# Patient Record
Sex: Female | Born: 1980 | Race: Black or African American | Marital: Married
Health system: Southern US, Community
[De-identification: ages and names within clinical notes are randomized; demographics above are authoritative.]

## PROBLEM LIST (undated history)

## (undated) DIAGNOSIS — M549 Dorsalgia, unspecified: Secondary | ICD-10-CM

## (undated) DIAGNOSIS — E559 Vitamin D deficiency, unspecified: Secondary | ICD-10-CM

## (undated) DIAGNOSIS — K259 Gastric ulcer, unspecified as acute or chronic, without hemorrhage or perforation: Secondary | ICD-10-CM

## (undated) DIAGNOSIS — K8020 Calculus of gallbladder without cholecystitis without obstruction: Secondary | ICD-10-CM

## (undated) DIAGNOSIS — D219 Benign neoplasm of connective and other soft tissue, unspecified: Secondary | ICD-10-CM

## (undated) DIAGNOSIS — D509 Iron deficiency anemia, unspecified: Secondary | ICD-10-CM

## (undated) DIAGNOSIS — M1990 Unspecified osteoarthritis, unspecified site: Secondary | ICD-10-CM

## (undated) DIAGNOSIS — F419 Anxiety disorder, unspecified: Secondary | ICD-10-CM

## (undated) DIAGNOSIS — R0602 Shortness of breath: Secondary | ICD-10-CM

## (undated) DIAGNOSIS — E7800 Pure hypercholesterolemia, unspecified: Secondary | ICD-10-CM

## (undated) DIAGNOSIS — J45990 Exercise induced bronchospasm: Secondary | ICD-10-CM

## (undated) DIAGNOSIS — E739 Lactose intolerance, unspecified: Secondary | ICD-10-CM

## (undated) DIAGNOSIS — E282 Polycystic ovarian syndrome: Secondary | ICD-10-CM

## (undated) DIAGNOSIS — N736 Female pelvic peritoneal adhesions (postinfective): Secondary | ICD-10-CM

## (undated) HISTORY — DX: Unspecified osteoarthritis, unspecified site: M19.90

## (undated) HISTORY — DX: Shortness of breath: R06.02

## (undated) HISTORY — DX: Vitamin D deficiency, unspecified: E55.9

## (undated) HISTORY — DX: Lactose intolerance, unspecified: E73.9

## (undated) HISTORY — DX: Calculus of gallbladder without cholecystitis without obstruction: K80.20

## (undated) HISTORY — DX: Gastric ulcer, unspecified as acute or chronic, without hemorrhage or perforation: K25.9

## (undated) HISTORY — DX: Anxiety disorder, unspecified: F41.9

## (undated) HISTORY — PX: MYOMECTOMY: SHX85

## (undated) HISTORY — DX: Dorsalgia, unspecified: M54.9

## (undated) HISTORY — DX: Iron deficiency anemia, unspecified: D50.9

## (undated) HISTORY — DX: Female pelvic peritoneal adhesions (postinfective): N73.6

## (undated) HISTORY — DX: Pure hypercholesterolemia, unspecified: E78.00

## (undated) HISTORY — DX: Exercise induced bronchospasm: J45.990

---

## 2018-03-11 LAB — OB RESULTS CONSOLE HEPATITIS B SURFACE ANTIGEN: Hepatitis B Surface Ag: NEGATIVE

## 2018-03-11 LAB — OB RESULTS CONSOLE ABO/RH: RH Type: POSITIVE

## 2018-03-11 LAB — OB RESULTS CONSOLE HIV ANTIBODY (ROUTINE TESTING): HIV: NONREACTIVE

## 2018-03-11 LAB — OB RESULTS CONSOLE RPR: RPR: NONREACTIVE

## 2018-03-11 LAB — OB RESULTS CONSOLE ANTIBODY SCREEN: Antibody Screen: NEGATIVE

## 2018-03-11 LAB — OB RESULTS CONSOLE GC/CHLAMYDIA
Chlamydia: NEGATIVE
Gonorrhea: NEGATIVE

## 2018-03-11 LAB — OB RESULTS CONSOLE RUBELLA ANTIBODY, IGM: Rubella: IMMUNE

## 2018-07-23 ENCOUNTER — Inpatient Hospital Stay (HOSPITAL_COMMUNITY)
Admission: AD | Admit: 2018-07-23 | Discharge: 2018-07-23 | Disposition: A | Discharge status: Home / Self Care | Payer: Commercial Managed Care - PPO | Attending: Obstetrics and Gynecology | Admitting: Obstetrics and Gynecology

## 2018-07-23 ENCOUNTER — Encounter (HOSPITAL_COMMUNITY): Payer: Self-pay

## 2018-07-23 ENCOUNTER — Other Ambulatory Visit: Payer: Self-pay

## 2018-07-23 DIAGNOSIS — Z3A27 27 weeks gestation of pregnancy: Secondary | ICD-10-CM | POA: Insufficient documentation

## 2018-07-23 DIAGNOSIS — O4702 False labor before 37 completed weeks of gestation, second trimester: Secondary | ICD-10-CM

## 2018-07-23 DIAGNOSIS — O368120 Decreased fetal movements, second trimester, not applicable or unspecified: Secondary | ICD-10-CM | POA: Diagnosis present

## 2018-07-23 NOTE — MAU Provider Note (Signed)
History     Chief Complaint  Patient presents with  . Decreased Fetal Movement  38 yo G1P0 MBF @ 27 3/[redacted] weeks gestation presents with c/o decreased FM since yesterday. Pt felt movements on arrival (-) vaginal bleeding or ctx  OB History    Gravida  1   Para      Term      Preterm      AB      Living        SAB      TAB      Ectopic      Multiple      Live Births              History reviewed. No pertinent past medical history.  Hx myomectomy  No family history on file.  Social History   Tobacco Use  . Smoking status: Not on file  Substance Use Topics  . Alcohol use: Not on file  . Drug use: Not on file    Allergies: No Known Allergies  Medications Prior to Admission  Medication Sig Dispense Refill Last Dose  . Prenatal Vit-Fe Fumarate-FA (MULTIVITAMIN-PRENATAL) 27-0.8 MG TABS tablet Take 1 tablet by mouth daily at 12 noon.        Physical Exam   Blood pressure 118/75, pulse 98, temperature 98.4 F (36.9 C), temperature source Oral, resp. rate 17, height 5\' 2"  (1.575 m), weight 98.4 kg, last menstrual period 12/19/2017.  General appearance: alert, cooperative and no distress Abdomen: gravid Pelvic: external genitalia normal and closed/firm/long OOP Extremities: no edema, redness or tenderness in the calves or thighs   Tracing: baseline 140 short variables. Good variability for gest age (+) ctx s not perceived by pt ED Course  DFM [place] affecting pregnancy P) daily kick ct. Reassurance. Increase oral fluid intake. Keep sched OB appt. ucx sent. PTL prec. D/c home MDM   Marvene Staff, MD 4:54 AM 07/23/2018

## 2018-07-23 NOTE — MAU Note (Signed)
Pt here with c/o decreased fetal movement since yesterday. Feeling baby now. Denies bleeding or leaking.

## 2018-07-23 NOTE — Discharge Instructions (Signed)

## 2018-07-24 LAB — CULTURE, OB URINE
Culture: NO GROWTH
Special Requests: NORMAL

## 2018-08-19 ENCOUNTER — Encounter (HOSPITAL_COMMUNITY): Payer: Self-pay

## 2018-08-31 ENCOUNTER — Other Ambulatory Visit: Payer: Self-pay | Admitting: Obstetrics and Gynecology

## 2018-09-17 ENCOUNTER — Encounter (HOSPITAL_COMMUNITY): Payer: Self-pay

## 2018-09-17 NOTE — Patient Instructions (Addendum)
ETTEL ALBERGO  09/17/2018   Your procedure is scheduled on:  09/28/18  Arrive at 19 at [facility] on [address] at [facility]  and [address]. You are invited to use the FREE valet parking or use the Visitor's parking deck.  Pick up the phone at the desk and dial [phone].  Call this number if you have problems the morning of surgery: [phone]  Remember:   Do not eat food:(After Midnight) Desps de medianoche.  Do not drink clear liquids: (After Midnight) Desps de medianoche.  Take these medicines the morning of surgery with A SIP OF WATER:  none   Do not wear jewelry, make-up or nail polish.  Do not wear lotions, powders, or perfumes. Do not wear deodorant.  Do not shave 48 hours prior to surgery.  Do not bring valuables to the hospital.  [facility] is not   responsible for any belongings or valuables brought to the hospital.  Contacts, dentures or bridgework may not be worn into surgery.  Leave suitcase in the car. After surgery it may be brought to your room.  For patients admitted to the hospital, checkout time is 11:00 AM the day of              discharge.      Please read over the following fact sheets that you were given:     Preparing for Surgery

## 2018-09-24 ENCOUNTER — Ambulatory Visit (HOSPITAL_COMMUNITY)
Admission: RE | Admit: 2018-09-24 | Discharge: 2018-09-24 | Disposition: A | Discharge status: Home / Self Care | Payer: Commercial Managed Care - PPO | Source: Ambulatory Visit | Attending: Obstetrics and Gynecology | Admitting: Obstetrics and Gynecology

## 2018-09-24 ENCOUNTER — Other Ambulatory Visit: Payer: Self-pay

## 2018-09-24 DIAGNOSIS — Z01812 Encounter for preprocedural laboratory examination: Secondary | ICD-10-CM | POA: Insufficient documentation

## 2018-09-24 DIAGNOSIS — Z1159 Encounter for screening for other viral diseases: Secondary | ICD-10-CM | POA: Diagnosis not present

## 2018-09-24 HISTORY — DX: Benign neoplasm of connective and other soft tissue, unspecified: D21.9

## 2018-09-24 HISTORY — DX: Polycystic ovarian syndrome: E28.2

## 2018-09-24 LAB — SARS CORONAVIRUS 2 (TAT 6-24 HRS): SARS Coronavirus 2: NEGATIVE

## 2018-09-24 NOTE — MAU Note (Signed)
Asymptomatic, swab collected.

## 2018-09-28 ENCOUNTER — Encounter (HOSPITAL_COMMUNITY): Payer: Self-pay | Admitting: *Deleted

## 2018-09-28 ENCOUNTER — Other Ambulatory Visit: Payer: Self-pay

## 2018-09-28 ENCOUNTER — Inpatient Hospital Stay (HOSPITAL_COMMUNITY): Payer: Commercial Managed Care - PPO | Admitting: Certified Registered Nurse Anesthetist

## 2018-09-28 ENCOUNTER — Encounter (HOSPITAL_COMMUNITY)
Admission: AD | Disposition: A | Discharge status: Home / Self Care | Payer: Self-pay | Source: Home / Self Care | Attending: Obstetrics and Gynecology

## 2018-09-28 ENCOUNTER — Inpatient Hospital Stay (HOSPITAL_COMMUNITY)
Admission: AD | Admit: 2018-09-28 | Discharge: 2018-10-01 | DRG: 787 | Disposition: A | Discharge status: Home / Self Care | Payer: Commercial Managed Care - PPO | Attending: Obstetrics and Gynecology | Admitting: Obstetrics and Gynecology

## 2018-09-28 DIAGNOSIS — D62 Acute posthemorrhagic anemia: Secondary | ICD-10-CM | POA: Diagnosis not present

## 2018-09-28 DIAGNOSIS — D509 Iron deficiency anemia, unspecified: Secondary | ICD-10-CM | POA: Diagnosis present

## 2018-09-28 DIAGNOSIS — Z9889 Other specified postprocedural states: Secondary | ICD-10-CM

## 2018-09-28 DIAGNOSIS — O9081 Anemia of the puerperium: Secondary | ICD-10-CM | POA: Diagnosis not present

## 2018-09-28 DIAGNOSIS — Z90711 Acquired absence of uterus with remaining cervical stump: Secondary | ICD-10-CM

## 2018-09-28 DIAGNOSIS — Z3A37 37 weeks gestation of pregnancy: Secondary | ICD-10-CM | POA: Diagnosis not present

## 2018-09-28 DIAGNOSIS — O3429 Maternal care due to uterine scar from other previous surgery: Secondary | ICD-10-CM | POA: Diagnosis present

## 2018-09-28 LAB — CBC
HCT: 32.9 % — ABNORMAL LOW (ref 36.0–46.0)
HCT: 21.5 % — ABNORMAL LOW (ref 36.0–46.0)
Hemoglobin: 10.8 g/dL — ABNORMAL LOW (ref 12.0–15.0)
Hemoglobin: 7.1 g/dL — ABNORMAL LOW (ref 12.0–15.0)
MCH: 31 pg (ref 26.0–34.0)
MCH: 31.6 pg (ref 26.0–34.0)
MCHC: 32.8 g/dL (ref 30.0–36.0)
MCHC: 33 g/dL (ref 30.0–36.0)
MCV: 94.5 fL (ref 80.0–100.0)
MCV: 95.6 fL (ref 80.0–100.0)
Platelets: 162 10*3/uL (ref 150–400)
Platelets: 110 10*3/uL — ABNORMAL LOW (ref 150–400)
RBC: 3.48 MIL/uL — ABNORMAL LOW (ref 3.87–5.11)
RBC: 2.25 MIL/uL — ABNORMAL LOW (ref 3.87–5.11)
RDW: 14.8 % (ref 11.5–15.5)
RDW: 14.7 % (ref 11.5–15.5)
WBC: 12.1 10*3/uL — ABNORMAL HIGH (ref 4.0–10.5)
WBC: 15.6 10*3/uL — ABNORMAL HIGH (ref 4.0–10.5)
nRBC: 0.5 % — ABNORMAL HIGH (ref 0.0–0.2)
nRBC: 0.3 % — ABNORMAL HIGH (ref 0.0–0.2)

## 2018-09-28 LAB — ABO/RH: ABO/RH(D): O POS

## 2018-09-28 LAB — RPR: RPR Ser Ql: NONREACTIVE

## 2018-09-28 SURGERY — Surgical Case
Anesthesia: Spinal

## 2018-09-28 MED ORDER — NALOXONE HCL 4 MG/10ML IJ SOLN
1.0000 ug/kg/h | INTRAVENOUS | Status: DC | PRN
  Filled 2018-09-28: qty 5

## 2018-09-28 MED ORDER — DIPHENHYDRAMINE HCL 25 MG PO CAPS: 25.0000 mg | ORAL_CAPSULE | ORAL | Status: DC | PRN

## 2018-09-28 MED ORDER — FENTANYL CITRATE (PF) 100 MCG/2ML IJ SOLN
INTRAMUSCULAR | Status: AC
  Filled 2018-09-28: qty 2

## 2018-09-28 MED ORDER — MISOPROSTOL 200 MCG PO TABS
800.0000 ug | ORAL_TABLET | Freq: Once | ORAL | Status: AC
  Administered 2018-09-28: 13:00:00 800 ug via RECTAL
  Filled 2018-09-28: qty 4

## 2018-09-28 MED ORDER — CEFAZOLIN SODIUM-DEXTROSE 2-4 GM/100ML-% IV SOLN
2.0000 g | INTRAVENOUS | Status: AC
  Administered 2018-09-28: 2 g via INTRAVENOUS

## 2018-09-28 MED ORDER — OXYTOCIN 40 UNITS IN NORMAL SALINE INFUSION - SIMPLE MED: 2.5000 [IU]/h | INTRAVENOUS | Status: AC

## 2018-09-28 MED ORDER — DEXAMETHASONE SODIUM PHOSPHATE 4 MG/ML IJ SOLN
INTRAMUSCULAR | Status: AC
  Filled 2018-09-28: qty 1

## 2018-09-28 MED ORDER — LACTATED RINGERS IV SOLN
INTRAVENOUS | Status: DC
  Administered 2018-09-28 – 2018-09-29 (×2): via INTRAVENOUS

## 2018-09-28 MED ORDER — DIBUCAINE (PERIANAL) 1 % EX OINT: 1.0000 "application " | TOPICAL_OINTMENT | CUTANEOUS | Status: DC | PRN

## 2018-09-28 MED ORDER — PHENYLEPHRINE HCL-NACL 20-0.9 MG/250ML-% IV SOLN
INTRAVENOUS | Status: AC
  Filled 2018-09-28: qty 250

## 2018-09-28 MED ORDER — KETOROLAC TROMETHAMINE 30 MG/ML IJ SOLN
30.0000 mg | Freq: Four times a day (QID) | INTRAMUSCULAR | Status: AC | PRN
  Administered 2018-09-28: 10:00:00 30 mg via INTRAVENOUS

## 2018-09-28 MED ORDER — METOCLOPRAMIDE HCL 5 MG/ML IJ SOLN
INTRAMUSCULAR | Status: AC
  Filled 2018-09-28: qty 2

## 2018-09-28 MED ORDER — SENNOSIDES-DOCUSATE SODIUM 8.6-50 MG PO TABS
2.0000 | ORAL_TABLET | ORAL | Status: DC
  Administered 2018-09-28 – 2018-09-30 (×3): 2 via ORAL
  Filled 2018-09-28 (×3): qty 2

## 2018-09-28 MED ORDER — FENTANYL CITRATE (PF) 100 MCG/2ML IJ SOLN
25.0000 ug | INTRAMUSCULAR | Status: DC | PRN
  Administered 2018-09-28 (×2): 50 ug via INTRAVENOUS

## 2018-09-28 MED ORDER — ACETAMINOPHEN 500 MG PO TABS
1000.0000 mg | ORAL_TABLET | Freq: Four times a day (QID) | ORAL | Status: AC
  Administered 2018-09-28 – 2018-09-29 (×3): 1000 mg via ORAL
  Filled 2018-09-28 (×4): qty 2

## 2018-09-28 MED ORDER — WITCH HAZEL-GLYCERIN EX PADS: 1.0000 "application " | MEDICATED_PAD | CUTANEOUS | Status: DC | PRN

## 2018-09-28 MED ORDER — SCOPOLAMINE 1 MG/3DAYS TD PT72: 1.0000 | MEDICATED_PATCH | Freq: Once | TRANSDERMAL | Status: DC

## 2018-09-28 MED ORDER — ONDANSETRON HCL 4 MG/2ML IJ SOLN: 4.0000 mg | Freq: Three times a day (TID) | INTRAMUSCULAR | Status: DC | PRN

## 2018-09-28 MED ORDER — ONDANSETRON HCL 4 MG/2ML IJ SOLN
INTRAMUSCULAR | Status: DC | PRN
  Administered 2018-09-28: 4 mg via INTRAVENOUS

## 2018-09-28 MED ORDER — MEPERIDINE HCL 25 MG/ML IJ SOLN: 6.2500 mg | INTRAMUSCULAR | Status: DC | PRN

## 2018-09-28 MED ORDER — ALBUMIN HUMAN 5 % IV SOLN
INTRAVENOUS | Status: DC | PRN
  Administered 2018-09-28 (×2): via INTRAVENOUS

## 2018-09-28 MED ORDER — SODIUM CHLORIDE 0.9 % IV SOLN
INTRAVENOUS | Status: DC | PRN
  Administered 2018-09-28: 08:00:00 40 [IU] via INTRAVENOUS

## 2018-09-28 MED ORDER — NALBUPHINE HCL 10 MG/ML IJ SOLN: 5.0000 mg | Freq: Once | INTRAMUSCULAR | Status: DC | PRN

## 2018-09-28 MED ORDER — ONDANSETRON HCL 4 MG/2ML IJ SOLN
INTRAMUSCULAR | Status: AC
  Filled 2018-09-28: qty 2

## 2018-09-28 MED ORDER — BUPIVACAINE HCL (PF) 0.25 % IJ SOLN
INTRAMUSCULAR | Status: AC
  Filled 2018-09-28: qty 10

## 2018-09-28 MED ORDER — MORPHINE SULFATE (PF) 0.5 MG/ML IJ SOLN
INTRAMUSCULAR | Status: AC
  Filled 2018-09-28: qty 10

## 2018-09-28 MED ORDER — NALBUPHINE HCL 10 MG/ML IJ SOLN: 5.0000 mg | INTRAMUSCULAR | Status: DC | PRN

## 2018-09-28 MED ORDER — IBUPROFEN 800 MG PO TABS
800.0000 mg | ORAL_TABLET | Freq: Three times a day (TID) | ORAL | Status: AC
  Administered 2018-09-28 – 2018-10-01 (×9): 800 mg via ORAL
  Filled 2018-09-28 (×9): qty 1

## 2018-09-28 MED ORDER — NALOXONE HCL 0.4 MG/ML IJ SOLN: 0.4000 mg | INTRAMUSCULAR | Status: DC | PRN

## 2018-09-28 MED ORDER — LACTATED RINGERS IV SOLN: INTRAVENOUS | Status: DC

## 2018-09-28 MED ORDER — LACTATED RINGERS IV SOLN
INTRAVENOUS | Status: DC
  Administered 2018-09-28 (×4): via INTRAVENOUS

## 2018-09-28 MED ORDER — SIMETHICONE 80 MG PO CHEW
80.0000 mg | CHEWABLE_TABLET | ORAL | Status: DC
  Administered 2018-09-28 – 2018-09-30 (×3): 80 mg via ORAL
  Filled 2018-09-28 (×3): qty 1

## 2018-09-28 MED ORDER — TETANUS-DIPHTH-ACELL PERTUSSIS 5-2.5-18.5 LF-MCG/0.5 IM SUSP: 0.5000 mL | Freq: Once | INTRAMUSCULAR | Status: DC

## 2018-09-28 MED ORDER — FENTANYL CITRATE (PF) 100 MCG/2ML IJ SOLN
INTRAMUSCULAR | Status: DC | PRN
  Administered 2018-09-28: 15 ug via INTRATHECAL

## 2018-09-28 MED ORDER — DIPHENHYDRAMINE HCL 25 MG PO CAPS: 25.0000 mg | ORAL_CAPSULE | Freq: Four times a day (QID) | ORAL | Status: DC | PRN

## 2018-09-28 MED ORDER — COCONUT OIL OIL
1.0000 "application " | TOPICAL_OIL | Status: DC | PRN
  Administered 2018-09-30: 1 via TOPICAL

## 2018-09-28 MED ORDER — ALBUMIN HUMAN 5 % IV SOLN
INTRAVENOUS | Status: AC
  Filled 2018-09-28: qty 500

## 2018-09-28 MED ORDER — ZOLPIDEM TARTRATE 5 MG PO TABS: 5.0000 mg | ORAL_TABLET | Freq: Every evening | ORAL | Status: DC | PRN

## 2018-09-28 MED ORDER — DEXAMETHASONE SODIUM PHOSPHATE 4 MG/ML IJ SOLN
INTRAMUSCULAR | Status: DC | PRN
  Administered 2018-09-28: 4 mg via INTRAVENOUS

## 2018-09-28 MED ORDER — METOCLOPRAMIDE HCL 5 MG/ML IJ SOLN
INTRAMUSCULAR | Status: DC | PRN
  Administered 2018-09-28: 10 mg via INTRAVENOUS

## 2018-09-28 MED ORDER — OXYTOCIN 40 UNITS IN NORMAL SALINE INFUSION - SIMPLE MED
INTRAVENOUS | Status: AC
  Filled 2018-09-28: qty 1000

## 2018-09-28 MED ORDER — CEFAZOLIN SODIUM-DEXTROSE 2-3 GM-%(50ML) IV SOLR
INTRAVENOUS | Status: DC | PRN
  Administered 2018-09-28: 2 g via INTRAVENOUS

## 2018-09-28 MED ORDER — OXYCODONE HCL 5 MG PO TABS
5.0000 mg | ORAL_TABLET | ORAL | Status: DC | PRN
  Administered 2018-09-29: 5 mg via ORAL
  Administered 2018-09-29 (×2): 10 mg via ORAL
  Administered 2018-09-30: 5 mg via ORAL
  Administered 2018-09-30 (×2): 10 mg via ORAL
  Administered 2018-10-01: 5 mg via ORAL
  Filled 2018-09-28 (×2): qty 1
  Filled 2018-09-28 (×2): qty 2
  Filled 2018-09-28: qty 1
  Filled 2018-09-28 (×2): qty 2

## 2018-09-28 MED ORDER — SIMETHICONE 80 MG PO CHEW
80.0000 mg | CHEWABLE_TABLET | Freq: Three times a day (TID) | ORAL | Status: DC
  Administered 2018-09-28 – 2018-10-01 (×8): 80 mg via ORAL
  Filled 2018-09-28 (×9): qty 1

## 2018-09-28 MED ORDER — SODIUM CHLORIDE 0.9% FLUSH: 3.0000 mL | INTRAVENOUS | Status: DC | PRN

## 2018-09-28 MED ORDER — SODIUM CHLORIDE 0.9 % IV SOLN
INTRAVENOUS | Status: DC | PRN
  Administered 2018-09-28: 08:00:00 via INTRAVENOUS

## 2018-09-28 MED ORDER — KETOROLAC TROMETHAMINE 30 MG/ML IJ SOLN
INTRAMUSCULAR | Status: AC
  Filled 2018-09-28: qty 1

## 2018-09-28 MED ORDER — BUPIVACAINE HCL (PF) 0.25 % IJ SOLN
INTRAMUSCULAR | Status: DC | PRN
  Administered 2018-09-28: 10 mL

## 2018-09-28 MED ORDER — MENTHOL 3 MG MT LOZG: 1.0000 | LOZENGE | OROMUCOSAL | Status: DC | PRN

## 2018-09-28 MED ORDER — PHENYLEPHRINE HCL (PRESSORS) 10 MG/ML IV SOLN
INTRAVENOUS | Status: DC | PRN
  Administered 2018-09-28: 120 ug via INTRAVENOUS
  Administered 2018-09-28 (×2): 40 ug via INTRAVENOUS

## 2018-09-28 MED ORDER — SODIUM CHLORIDE 0.9 % IR SOLN
Status: DC | PRN
  Administered 2018-09-28: 1

## 2018-09-28 MED ORDER — TRIAMCINOLONE ACETONIDE 40 MG/ML IJ SUSP
INTRAMUSCULAR | Status: DC | PRN
  Administered 2018-09-28: 40 mg via INTRAMUSCULAR

## 2018-09-28 MED ORDER — KETOROLAC TROMETHAMINE 30 MG/ML IJ SOLN: 30.0000 mg | Freq: Four times a day (QID) | INTRAMUSCULAR | Status: AC | PRN

## 2018-09-28 MED ORDER — MORPHINE SULFATE (PF) 0.5 MG/ML IJ SOLN
INTRAMUSCULAR | Status: DC | PRN
  Administered 2018-09-28: .15 mg via INTRATHECAL

## 2018-09-28 MED ORDER — PHENYLEPHRINE HCL-NACL 20-0.9 MG/250ML-% IV SOLN
INTRAVENOUS | Status: DC | PRN
  Administered 2018-09-28: 60 ug/min via INTRAVENOUS

## 2018-09-28 MED ORDER — DIPHENHYDRAMINE HCL 50 MG/ML IJ SOLN: 12.5000 mg | INTRAMUSCULAR | Status: DC | PRN

## 2018-09-28 MED ORDER — TRIAMCINOLONE ACETONIDE 40 MG/ML IJ SUSP
INTRAMUSCULAR | Status: AC
  Filled 2018-09-28: qty 1

## 2018-09-28 MED ORDER — BUPIVACAINE IN DEXTROSE 0.75-8.25 % IT SOLN
INTRATHECAL | Status: DC | PRN
  Administered 2018-09-28: 1.4 mL via INTRATHECAL

## 2018-09-28 MED ORDER — PRENATAL MULTIVITAMIN CH
1.0000 | ORAL_TABLET | Freq: Every day | ORAL | Status: DC
  Administered 2018-09-29 – 2018-10-01 (×3): 1 via ORAL
  Filled 2018-09-28 (×3): qty 1

## 2018-09-28 MED ORDER — CEFAZOLIN SODIUM-DEXTROSE 2-4 GM/100ML-% IV SOLN
INTRAVENOUS | Status: AC
  Filled 2018-09-28: qty 100

## 2018-09-28 MED ORDER — SIMETHICONE 80 MG PO CHEW: 80.0000 mg | CHEWABLE_TABLET | ORAL | Status: DC | PRN

## 2018-09-28 MED ORDER — METOCLOPRAMIDE HCL 5 MG/ML IJ SOLN: 10.0000 mg | Freq: Once | INTRAMUSCULAR | Status: DC | PRN

## 2018-09-28 SURGICAL SUPPLY — 49 items
BARRIER ADHS 3X4 INTERCEED (GAUZE/BANDAGES/DRESSINGS) ×3 IMPLANT
BENZOIN TINCTURE PRP APPL 2/3 (GAUZE/BANDAGES/DRESSINGS) ×2 IMPLANT
CHLORAPREP W/TINT 26ML (MISCELLANEOUS) ×3 IMPLANT
CLAMP CORD UMBIL (MISCELLANEOUS) IMPLANT
CLOSURE STERI STRIP 1/2 X4 (GAUZE/BANDAGES/DRESSINGS) ×2 IMPLANT
CLOSURE WOUND 1/2 X4 (GAUZE/BANDAGES/DRESSINGS)
CLOTH BEACON ORANGE TIMEOUT ST (SAFETY) ×3 IMPLANT
DRAPE C SECTION CLR SCREEN (DRAPES) ×3 IMPLANT
DRSG OPSITE POSTOP 4X10 (GAUZE/BANDAGES/DRESSINGS) ×3 IMPLANT
ELECT REM PT RETURN 9FT ADLT (ELECTROSURGICAL) ×3
ELECTRODE REM PT RTRN 9FT ADLT (ELECTROSURGICAL) ×1 IMPLANT
EXTRACTOR VACUUM M CUP 4 TUBE (SUCTIONS) IMPLANT
EXTRACTOR VACUUM M CUP 4' TUBE (SUCTIONS)
GAUZE SPONGE 4X4 12PLY STRL LF (GAUZE/BANDAGES/DRESSINGS) ×4 IMPLANT
GLOVE BIOGEL PI IND STRL 7.0 (GLOVE) ×2 IMPLANT
GLOVE BIOGEL PI INDICATOR 7.0 (GLOVE) ×4
GLOVE ECLIPSE 6.5 STRL STRAW (GLOVE) ×3 IMPLANT
GOWN STRL REUS W/TWL LRG LVL3 (GOWN DISPOSABLE) ×6 IMPLANT
KIT ABG SYR 3ML LUER SLIP (SYRINGE) ×2 IMPLANT
NDL HYPO 25X5/8 SAFETYGLIDE (NEEDLE) IMPLANT
NEEDLE HYPO 22GX1.5 SAFETY (NEEDLE) ×3 IMPLANT
NEEDLE HYPO 25X5/8 SAFETYGLIDE (NEEDLE) ×3 IMPLANT
NS IRRIG 1000ML POUR BTL (IV SOLUTION) ×3 IMPLANT
PACK C SECTION WH (CUSTOM PROCEDURE TRAY) ×3 IMPLANT
PAD ABD 7.5X8 STRL (GAUZE/BANDAGES/DRESSINGS) ×4 IMPLANT
PAD OB MATERNITY 4.3X12.25 (PERSONAL CARE ITEMS) ×3 IMPLANT
RETRACTOR TRAXI PANNICULUS (MISCELLANEOUS) IMPLANT
RTRCTR C-SECT PINK 25CM LRG (MISCELLANEOUS) IMPLANT
STRIP CLOSURE SKIN 1/2X4 (GAUZE/BANDAGES/DRESSINGS) IMPLANT
SUT CHROMIC GUT AB #0 18 (SUTURE) IMPLANT
SUT MNCRL 0 VIOLET CTX 36 (SUTURE) ×3 IMPLANT
SUT MON AB 2-0 SH 27 (SUTURE)
SUT MON AB 2-0 SH27 (SUTURE) IMPLANT
SUT MON AB 3-0 SH 27 (SUTURE)
SUT MON AB 3-0 SH27 (SUTURE) IMPLANT
SUT MON AB 4-0 PS1 27 (SUTURE) IMPLANT
SUT MONOCRYL 0 CTX 36 (SUTURE) ×12
SUT PLAIN 2 0 (SUTURE)
SUT PLAIN 2 0 XLH (SUTURE) IMPLANT
SUT PLAIN ABS 2-0 CT1 27XMFL (SUTURE) IMPLANT
SUT VIC AB 0 CT1 36 (SUTURE) ×6 IMPLANT
SUT VIC AB 2-0 CT1 27 (SUTURE) ×2
SUT VIC AB 2-0 CT1 TAPERPNT 27 (SUTURE) ×1 IMPLANT
SUT VIC AB 4-0 PS2 27 (SUTURE) IMPLANT
SYR CONTROL 10ML LL (SYRINGE) ×3 IMPLANT
TOWEL OR 17X24 6PK STRL BLUE (TOWEL DISPOSABLE) ×3 IMPLANT
TRAXI PANNICULUS RETRACTOR (MISCELLANEOUS) ×2
TRAY FOLEY W/BAG SLVR 14FR LF (SET/KITS/TRAYS/PACK) IMPLANT
WATER STERILE IRR 1000ML POUR (IV SOLUTION) ×3 IMPLANT

## 2018-09-28 NOTE — Lactation Note (Signed)
This note was copied from a baby's chart. Lactation Consultation Note  Patient Name: Cheyenne Keller [id number] Date: 09/28/2018 Reason for consult: Initial assessment  0211 - 1735 - I visited [name]. She was breast feeding her daughter, [name] on her right breast upon entry. Mom was lying down and baby was prone over mom's chest in cradle hold. Rhythmic suckling sequences noted.   I provided reassurance to [name] that baby was able to breath in this position, and I discouraged her from pulling back on the breast tissue to make an "air hole." I gently rotated baby's hips a bit to help mom observe baby's nostril while feeding, and we observed her abdomen.  I discussed day 1 infant feeding patterns, output expectations (her diaper appeared wet while feeding), and I recommended that she feed baby on demand.   Ms. Galluzzo has a Training and development officer pump at home that she obtained via insurance.  I showed [name] how to hand express, and we noted colostrum. I encouraged frequent hand expression today for stimulation, and mom can feed back any EBM to baby.  Finally, I shared our community breast feeding resources and the lactation study invitation. [name] agreed to see the lactation study invitation, and I left the information in her room.   Maternal Data Formula Feeding for Exclusion: No Has patient been taught Hand Expression?: Yes Does the patient have breastfeeding experience prior to this delivery?: No  Feeding Feeding Type: Breast Fed  LATCH Score Latch: Grasps breast easily, tongue down, lips flanged, rhythmical sucking.  Audible Swallowing: Spontaneous and intermittent  Type of Nipple: Everted at rest and after stimulation  Comfort (Breast/Nipple): Filling, red/small blisters or bruises, mild/mod discomfort  Hold (Positioning): No assistance needed to correctly position infant at breast.  LATCH Score: 9  Interventions Interventions:  Breast feeding basics reviewed;Skin to skin;Hand express;Adjust position  Lactation Tools Discussed/Used     Consult Status Consult Status: Follow-up Date: 09/29/18 Follow-up type: In-patient    Lenore Manner 09/28/2018, 5:39 PM

## 2018-09-28 NOTE — Brief Op Note (Signed)
09/28/2018  9:03 AM  PATIENT:  Cheyenne Keller  38 y.o. female  PRE-OPERATIVE DIAGNOSIS:  Previous Myomectomy, IUP@ 37 wk  POST-OPERATIVE DIAGNOSIS:  Previous Myomectomy, IUP @ 37 weeks, severe abdominopelvic adhesions  PROCEDURE:  Primary Cesarean section, Classical hysterotomy  SURGEON:  Surgeon(s) and Role:    * Servando Salina, MD - Primary  PHYSICIAN ASSISTANT:   ASSISTANTS: Artelia Laroche, CNM   ANESTHESIA:   spinal FINDINGS;  Live female vtx, delivered as breech, severe adhesions on left ant side of the uterus to abd wall. Undeveloped LUS with large vessels. Ovaries and tubes not seen due to scarring, distorted endom cavity with left uterine fibroid EBL:  975   BLOOD ADMINISTERED:none  DRAINS: none   LOCAL MEDICATIONS USED:  MARCAINE     SPECIMEN:  Source of Specimen:  placenta  DISPOSITION OF SPECIMEN:  N/A  COUNTS:  YES  TOURNIQUET:  * No tourniquets in log *  DICTATION: .Other Dictation: Dictation Number [id number]  PLAN OF CARE: Admit to inpatient   PATIENT DISPOSITION:  PACU - hemodynamically stable.   Delay start of Pharmacological VTE agent (>24hrs) due to surgical blood loss or risk of bleeding: no

## 2018-09-28 NOTE — Anesthesia Postprocedure Evaluation (Signed)
Anesthesia Post Note  Patient: Cheyenne Keller  Procedure(s) Performed: Primary CESAREAN SECTION (N/A )     Patient location during evaluation: PACU Anesthesia Type: Spinal Level of consciousness: awake and alert Pain management: pain level controlled Vital Signs Assessment: post-procedure vital signs reviewed and stable Respiratory status: spontaneous breathing and respiratory function stable Cardiovascular status: blood pressure returned to baseline and stable Postop Assessment: no headache, no backache, spinal receding and no apparent nausea or vomiting Anesthetic complications: no    Last Vitals:  Vitals:   09/28/18 1030 09/28/18 1128  BP: (!) 83/53 91/61  Pulse: 72 80  Resp: 14 16  Temp:  (!) 36.3 C  SpO2: 100% 100%    Last Pain:  Vitals:   09/28/18 1128  TempSrc: Oral  PainSc: 4    Pain Goal:                   [name]

## 2018-09-28 NOTE — Op Note (Signed)
NAME: Cheyenne Keller, Cheyenne Keller MEDICAL RECORD UX:[id number] ACCOUNT [id number] DATE OF BIRTH:1980/03/24 FACILITY: [facility] LOCATION: [facility] PHYSICIAN:Rodrigues Urbanek A. Willem Klingensmith, MD  OPERATIVE REPORT  DATE OF PROCEDURE:  09/28/2018  PREOPERATIVE DIAGNOSES:  Previous myomectomy, intrauterine gestation at 37 weeks, fibroid uterus.  PROCEDURE:  Primary cesarean section, classical uterine incision.  POSTOPERATIVE DIAGNOSES:  Previous myomectomy, intrauterine gestation at 37 weeks, fibroid uterus, extensive abdominopelvic adhesions.  ANESTHESIA:  Spinal.  SURGEON:  Servando Salina, MD  ASSISTANT:  Artelia Laroche, CNM  DESCRIPTION OF PROCEDURE:  Under adequate spinal anesthesia, the patient was placed in a supine position with a left lateral tilt.  She was sterilely prepped and draped in the usual fashion.  An indwelling Foley catheter was sterilely placed.  A  Pfannenstiel skin incision was made through the previous scar and carried down to the rectus fascia.  The rectus fascia was opened transversely with Mayo scissors.  The rectus fascia was then bluntly and sharply dissected off the rectus muscle in a  superior and inferior fashion.  The rectus muscle was sharply opened in the midline.  Attempt to enter the parietal peritoneum was difficult.  Ultimately, blunt dissection resulted in entry with palpation within the cavity, limited by adhesions, particularly on the patient's left side of her uterus.  There was an undeveloped lower uterine segment.  The adhesions did not allow for the Alexis retractor to be placed.  The bladder retractor was inserted.  Lower uterine segment had multiple large vessels with  the uterus appearing a little bit dextrorotated.  The decision was made to perform a classical incision.  The incision was then made; however, it was noted to be thick.  The uterine incision was carried down to about over an inch  thickness with active bleeding noted and finally identification of the  amniotic sac.  Artificial rupture occurred.  The incision was then extended using the bandage scissors.  Subsequent attempted delivery of the vertex was unsuccessful.  Additional extension of  that incision was then made, and with manipulation, the baby's right foot was identified, and the baby was delivered as a breech using the usual breech maneuver.  The cord was quickly clamped, cut.  The baby was transferred to the awaiting pediatricians  who assigned Apgars of 6 and  9 at one and five minutes.  The placenta was spontaneously removed intact.  Uterine cavity was explored manually.  It was distorted by a large fibroid on the left.  The cavity was cleaned, swept digitally and with a sponge.  The  incision was then closed using 0 Monocryl sutures top and bottom to try control bleeding.  The attempt to identify the adhesions on the left was unsuccessful.  Neither tubes or ovaries were noted bilaterally, and additional sutures were placed along the  incision line for hemostasis.  Once hemostasis was achieved, the abdomen was irrigated and suctioned of debris.  Interceed was placed overlying the incision.  What appeared to be the parietal peritoneum was closed with 2-0 Vicryl.  The rectus fascia was  subsequently closed with 0 Vicryl x2.  The subcutaneous area was irrigated, small bleeders cauterized, interrupted 2-0 plain sutures placed, and the skin approximated using 4-0 Vicryl subcuticular closure.  SPECIMEN:  Placenta, not sent to pathology.  ESTIMATED BLOOD LOSS:  975 ml.  INTRAOPERATIVE FLUIDS:  3 L.  URINE OUTPUT:  400 mL.  SPONGE AND INSTRUMENT COUNTS:  x2 was correct.  DISPOSITION:  The patient tolerated the procedure well and was transferred to recovery room in  stable condition.  LN/NUANCE  D:09/28/2018 T:09/28/2018 JOB:[id number]

## 2018-09-28 NOTE — Consult Note (Signed)
Neonatology Note:   Attendance at C-section:    I was asked by Dr. Garwin Brothers to attend this primary C/S at 37 0/7 weeks due to previous myomectomy. The mother is a G3P0A2 O pos, GBS neg with PCOS, fibroids, and IVF pregnancy. Mother got Betamethasone at 36 weeks. ROM at delivery, fluid clear. Infant delivered double footling breech (difficult extraction) and was floppy and blue at birth, so delayed cord clamping was not done. Needed bulb suctioning and vigorous stimulation, then began to cry. Ap 6/9. Lungs clear to ausc in DR, tone excellent, color pink, and no distress at 5 minutes. Infant is able to remain with her mother for skin to skin time under nursing supervision. Transferred to the care of Pediatrician.   Real Cons, MD

## 2018-09-28 NOTE — H&P (Signed)
Cheyenne Keller is a 39 y.o. female presenting for primary C/S due to previous myomectomy. Uncomplicated PNC OB History    Gravida  3   Para      Term      Preterm      AB  2   Living        SAB  2   TAB      Ectopic      Multiple      Live Births             Past Medical History:  Diagnosis Date  . Fibroid   . PCOS (polycystic ovarian syndrome)    Past Surgical History:  Procedure Laterality Date  . MYOMECTOMY     Family History: family history includes Heart disease in her father; Stroke in her mother. Social History:  reports that she has never smoked. She has never used smokeless tobacco. She reports previous alcohol use. She reports that she does not use drugs.     Maternal Diabetes: No Genetic Screening: Declined. Had PG testing with IVF Maternal Ultrasounds/Referrals: Normal Fetal Ultrasounds or other Referrals:  None Maternal Substance Abuse:  No Significant Maternal Medications:  None Significant Maternal Lab Results:  Group B Strep negative Other Comments:  IVF preg, BMZ complete 36 wk  Review of Systems  All other systems reviewed and are negative.  History   Blood pressure 115/68, pulse 90, temperature 98.3 F (36.8 C), temperature source Oral, resp. rate 18, height 5\' 2"  (1.575 m), weight 99.2 kg, last menstrual period 12/19/2017, SpO2 100 %. Exam Physical Exam  Constitutional: She is oriented to person, place, and time. She appears well-developed and well-nourished.  HENT:  Head: Atraumatic.  Eyes: EOM are normal.  Neck: Neck supple.  Cardiovascular: Regular rhythm.  Respiratory: Breath sounds normal.  GI: Soft.  Genitourinary:    Genitourinary Comments: Cervix closed Uterus gravid   Neurological: She is alert and oriented to person, place, and time.  Skin: Skin is warm and dry.  Psychiatric: She has a normal mood and affect.    Prenatal labs: ABO, Rh: O positive Antibody: negative Rubella: Immune (12/26 0000) RPR:  Nonreactive (12/26 0000)  HBsAg: Negative (12/26 0000)  HIV: Non-reactive (12/26 0000)  GBS:   negative  Assessment/Plan: Previous myomectomy IVF preg IUP @ 37 wk P) Primary C/S. Risk of surgery reviewed including infection, bleeding, injury to bladder, bowel, ureter, internal scar tissue, poss need for blood transfusion and its risk, internal scar tissue. All ? answered   Cheyenne Keller A Robt Okuda 09/28/2018, 5:46 AM

## 2018-09-28 NOTE — Anesthesia Preprocedure Evaluation (Signed)
Anesthesia Evaluation  Patient identified by MRN, date of birth, ID band Patient awake    Reviewed: Allergy & Precautions, NPO status , Patient's Chart, lab work & pertinent test results  Airway Mallampati: II  TM Distance: >3 FB Neck ROM: Full    Dental no notable dental hx.    Pulmonary neg pulmonary ROS,    Pulmonary exam normal breath sounds clear to auscultation       Cardiovascular negative cardio ROS Normal cardiovascular exam Rhythm:Regular Rate:Normal     Neuro/Psych negative neurological ROS  negative psych ROS   GI/Hepatic negative GI ROS, Neg liver ROS,   Endo/Other  negative endocrine ROS  Renal/GU negative Renal ROS  negative genitourinary   Musculoskeletal negative musculoskeletal ROS (+)   Abdominal   Peds negative pediatric ROS (+)  Hematology negative hematology ROS (+)   Anesthesia Other Findings   Reproductive/Obstetrics (+) Pregnancy                             Anesthesia Physical Anesthesia Plan  ASA: II  Anesthesia Plan: Spinal   Post-op Pain Management:    Induction:   PONV Risk Score and Plan: 3 and Ondansetron, Dexamethasone, Scopolamine patch - Pre-op and Treatment may vary due to age or medical condition  Airway Management Planned: Natural Airway  Additional Equipment:   Intra-op Plan:   Post-operative Plan:   Informed Consent: I have reviewed the patients History and Physical, chart, labs and discussed the procedure including the risks, benefits and alternatives for the proposed anesthesia with the patient or authorized representative who has indicated his/her understanding and acceptance.     Dental advisory given  Plan Discussed with:   Anesthesia Plan Comments:         Anesthesia Quick Evaluation

## 2018-09-28 NOTE — Anesthesia Procedure Notes (Signed)
Spinal  Patient location during procedure: OR Staffing Anesthesiologist: Dorell Gatlin, MD Performed: anesthesiologist  Preanesthetic Checklist Completed: patient identified, site marked, surgical consent, pre-op evaluation, timeout performed, IV checked, risks and benefits discussed and monitors and equipment checked Spinal Block Patient position: sitting Prep: DuraPrep Patient monitoring: heart rate, continuous pulse ox and blood pressure Approach: midline Location: L3-4 Injection technique: single-shot Needle Needle type: Sprotte  Needle gauge: 24 G Needle length: 9 cm Additional Notes Expiration date of kit checked and confirmed. Patient tolerated procedure well, without complications.       

## 2018-09-28 NOTE — Transfer of Care (Signed)
Immediate Anesthesia Transfer of Care Note  Patient: Cheyenne Keller  Procedure(s) Performed: Primary CESAREAN SECTION (N/A )  Patient Location: PACU  Anesthesia Type:Spinal  Level of Consciousness: awake, alert  and oriented  Airway & Oxygen Therapy: Patient Spontanous Breathing  Post-op Assessment: Report given to RN and Post -op Vital signs reviewed and stable  Post vital signs: Reviewed and stable  Last Vitals:  Vitals Value Taken Time  BP 94/49 09/28/18 0928  Temp    Pulse 93 09/28/18 0930  Resp 32 09/28/18 0930  SpO2 95 % 09/28/18 0930  Vitals shown include unvalidated device data.  Last Pain:  Vitals:   09/28/18 0541  TempSrc: Oral  PainSc: 0-No pain         Complications: No apparent anesthesia complications

## 2018-09-29 LAB — CBC
HCT: 16.9 % — ABNORMAL LOW (ref 36.0–46.0)
HCT: 22.4 % — ABNORMAL LOW (ref 36.0–46.0)
Hemoglobin: 5.5 g/dL — CL (ref 12.0–15.0)
Hemoglobin: 7.6 g/dL — ABNORMAL LOW (ref 12.0–15.0)
MCH: 31.3 pg (ref 26.0–34.0)
MCH: 30.4 pg (ref 26.0–34.0)
MCHC: 32.5 g/dL (ref 30.0–36.0)
MCHC: 33.9 g/dL (ref 30.0–36.0)
MCV: 96 fL (ref 80.0–100.0)
MCV: 89.6 fL (ref 80.0–100.0)
Platelets: 103 10*3/uL — ABNORMAL LOW (ref 150–400)
Platelets: 104 10*3/uL — ABNORMAL LOW (ref 150–400)
RBC: 1.76 MIL/uL — ABNORMAL LOW (ref 3.87–5.11)
RBC: 2.5 MIL/uL — ABNORMAL LOW (ref 3.87–5.11)
RDW: 14.7 % (ref 11.5–15.5)
RDW: 15.3 % (ref 11.5–15.5)
WBC: 19.6 10*3/uL — ABNORMAL HIGH (ref 4.0–10.5)
WBC: 19.7 10*3/uL — ABNORMAL HIGH (ref 4.0–10.5)
nRBC: 0.2 % (ref 0.0–0.2)
nRBC: 0.2 % (ref 0.0–0.2)

## 2018-09-29 LAB — PREPARE RBC (CROSSMATCH)

## 2018-09-29 MED ORDER — SODIUM CHLORIDE 0.9% IV SOLUTION
Freq: Once | INTRAVENOUS | Status: AC
  Administered 2018-09-30: 12:00:00 via INTRAVENOUS

## 2018-09-29 MED ORDER — SODIUM CHLORIDE 0.9% IV SOLUTION: Freq: Once | INTRAVENOUS | Status: DC

## 2018-09-29 NOTE — Progress Notes (Signed)
Patient ID: Cheyenne Keller, female   DOB: 04-18-1980, 38 y.o.   MRN: [id number] Subjective: POD# 1, classical incision, hx myomectomy severe PPH Live born female  Birth Weight: 7 lb 6.2 oz (3350 g) APGAR: 6, 9  Newborn Delivery   Birth date/time: 09/28/2018 08:05:00 Delivery type: C-Section, Low Transverse Trial of labor: No C-section categorization: Primary     Baby name: Nshira Delivering provider: COUSINS, SHERONETTE   Feeding: breast  Pain control at delivery: Spinal   Reports feeling better this am, was dizzy yesterday when trying to stand, was able to stand at [facility] this AM w/o assist.  Patient reports tolerating PO.   Breast symptoms: latching baby by herself.  Pain controlled with PO meds Denies HA/SOB/C/P/N/V/dizziness. Flatus present. She reports vaginal bleeding as normal, without clots.  No ambulation yet, foley cath in place, clear yellow urine.     Objective:   VS:    Vitals:   09/28/18 1730 09/28/18 2147 09/29/18 0130 09/29/18 0545  BP:  99/66 113/70 (!) 109/58  Pulse:  92 62 81  Resp: 18 18 18 18   Temp: (!) 97.5 F (36.4 C) 98.7 F (37.1 C) 98.3 F (36.8 C) 98.5 F (36.9 C)  TempSrc: Oral Oral Oral Oral  SpO2:      Weight:      Height:          Intake/Output Summary (Last 24 hours) at 09/29/2018 0837 Last data filed at 09/29/2018 0600 Gross per 24 hour  Intake 4348.35 ml  Output 5150 ml  Net -801.65 ml      CBC Latest Ref Rng & Units 09/29/2018 09/28/2018 09/28/2018  WBC 4.0 - 10.5 K/uL 19.6(H) 15.6(H) 12.1(H)  Hemoglobin 12.0 - 15.0 g/dL 5.5(LL) 7.1(L) 10.8(L)  Hematocrit 36.0 - 46.0 % 16.9(L) 21.5(L) 32.9(L)  Platelets 150 - 400 K/uL 103(L) 110(L) 162     Blood type: --/--/O POS (07/14 0528)  Rubella: Immune (12/26 0000)    Physical Exam:  General: alert, cooperative and no distress CV: Regular rate and rhythm Resp: clear Abdomen: soft, nontender, normal bowel sounds Incision: pressure dressing soiled 30%, removed and placed HC  dressing over steristrips, incision well approximated and no active bleeding Uterine Fundus: firm, below umbilicus, surgical tender Lochia: moderate Ext: extremities normal, atraumatic, no cyanosis or edema      Assessment/Plan: 38 y.o.   POD# 1. [id number]                  Principal Problem:   Postpartum care following cesarean delivery (7/14) Active Problems:   History of myomectomy   Cesarean delivery delivered   IDA (iron deficiency anemia)   Acute blood loss anemia  - Risk & benefits of transfusion with acute blood loss and hypovolemia reviewed with family.  No treatment --> prolonged recovery, interference with milk production, fatigue, shortness of breath, risk for syncope and injury.  Transfusion --> treatment will replace blood cells and volume, risk for exposure to infectious blood-borne disease even with screening, allergic reaction, fever.  Will transfuse 2 units PRBC, rpt CBC post transfusion. IV ferriheme tomorrow, then oral iron supplement thereafter  Doing well, stable.               Advance diet as tolerated Encourage rest when baby rests Breastfeeding support Encourage to ambulate once transfusion completed Routine post-op care  Juliene Pina, CNM, MSN 09/29/2018, 8:37 AM

## 2018-09-29 NOTE — Progress Notes (Signed)
Critical value for Hgb 5.5 called to Cheyenne Keller; received a verbal order to place order for two units of PRBC's if Type and Cross has already been done.  Midwife stated she would come and talk with the patient.  During report, nightshift RN passed on that patient was dizzy and nauseous when trying to stand yesterday but did well this morning.  Will continue to monitor.

## 2018-09-30 ENCOUNTER — Encounter (HOSPITAL_COMMUNITY): Payer: Self-pay | Admitting: Obstetrics and Gynecology

## 2018-09-30 MED ORDER — SODIUM CHLORIDE 0.9 % IV SOLN
510.0000 mg | INTRAVENOUS | Status: DC
  Administered 2018-09-30: 510 mg via INTRAVENOUS
  Filled 2018-09-30: qty 17

## 2018-09-30 NOTE — Progress Notes (Signed)
Patient ID: Cheyenne Keller, female   DOB: 1980/09/01, 38 y.o.   MRN: [id number] Subjective: POD# 2, classical incision, hx myomectomy, severe PPH Live born female  Birth Weight: 7 lb 6.2 oz (3350 g) APGAR: 6, 9  Newborn Delivery   Birth date/time: 09/28/2018 08:05:00 Delivery type: C-Section, Low Transverse Trial of labor: No C-section categorization: Primary     Baby name: Nshira Delivering provider: COUSINS, SHERONETTE   Feeding: breast  Pain control at delivery: Spinal   Reports feeling much better than yesterday.  Patient reports tolerating PO.   Breast symptoms:+ colostrum +nipple pain w/ latch Pain controlled with PO meds Denies HA/SOB/C/P/N/V/dizziness. Flatus present. She reports vaginal bleeding as normal, without clots.  She is ambulating, urinating without difficulty.     Objective:   VS:    Vitals:   09/29/18 1455 09/29/18 1645 09/29/18 2252 09/30/18 0544  BP: (!) 115/58 (!) 109/56 112/61 115/64  Pulse: 94 (!) 102 85 92  Resp: 18 19 18 18   Temp: 99.3 F (37.4 C) 98.9 F (37.2 C) 98.2 F (36.8 C) 98.4 F (36.9 C)  TempSrc: Oral Oral Oral Oral  SpO2:  100% 100%   Weight:      Height:          Intake/Output Summary (Last 24 hours) at 09/30/2018 1121 Last data filed at 09/29/2018 2146 Gross per 24 hour  Intake 741.33 ml  Output 1700 ml  Net -958.67 ml        Recent Labs    09/29/18 0556 09/29/18 1946  WBC 19.6* 19.7*  HGB 5.5* 7.6*  HCT 16.9* 22.4*  PLT 103* 104*     Blood type: --/--/O POS, O POS (07/14 0528)  Rubella: Immune (12/26 0000)  Vaccines: TDaP UTD         Flu    UTD   Physical Exam:  General: alert, cooperative and no distress Abdomen: soft, nontender, normal bowel sounds Incision: clean, dry and intact Uterine Fundus: firm, below umbilicus, nontender Lochia: minimal Ext: extremities normal, atraumatic, no cyanosis or edema      Assessment/Plan: 38 y.o.   POD# 2. [id number]                  Principal  Problem:   Postpartum care following cesarean delivery (7/14) Active Problems:   History of myomectomy   Cesarean delivery delivered   IDA (iron deficiency anemia)   Acute blood loss anemia  - s/p 2 PRBC, hgb ^ 7.6 and asymptomatic  - IV Feraheme today and repeat in 1 week outpatient  - start oral Fe and Mag ox at DC  Doing well, stable.               Advance diet as tolerated Encourage rest when baby rests Breastfeeding support, [place] assist for correct latch Encourage to ambulate Routine post-op care  Juliene Pina, CNM, MSN 09/30/2018, 11:21 AM

## 2018-09-30 NOTE — Lactation Note (Signed)
This note was copied from a baby's chart. Lactation Consultation Note  Patient Name: Girl Takeia Ciaravino YBWLS'L Date: 09/30/2018 Reason for consult: Initial assessment;Infant weight loss;Early term 37-38.6wks Babyis 49 hours/9% weight loss.  Baby is feeding frequently and good output noted.  MD ordered formula supplementation with 15 mls after feedings.  Mom recently gave formula and baby is sleeping on her chest.  Discussed milk coming to volume and the prevention and treatment of engorgement.  Mom does have a pump at home.  Recommended she discontinue formula when milk comes to volume.  She denies questions.  Lactation outpatient services and support reviewed and encouraged.  Maternal Data    Feeding Feeding Type: Breast Fed  LATCH Score Latch: Grasps breast easily, tongue down, lips flanged, rhythmical sucking.  Audible Swallowing: A few with stimulation  Type of Nipple: Everted at rest and after stimulation  Comfort (Breast/Nipple): Filling, red/small blisters or bruises, mild/mod discomfort  Hold (Positioning): No assistance needed to correctly position infant at breast.  LATCH Score: 8  Interventions    Lactation Tools Discussed/Used     Consult Status Consult Status: Complete Follow-up type: Call as needed    Ave Filter 09/30/2018, 9:50 AM

## 2018-10-01 MED ORDER — OXYCODONE HCL 5 MG PO TABS: 5.0000 mg | ORAL_TABLET | Freq: Four times a day (QID) | ORAL | 0 refills | Status: AC | PRN

## 2018-10-01 MED ORDER — MAGNESIUM OXIDE 400 (241.3 MG) MG PO TABS
400.0000 mg | ORAL_TABLET | Freq: Every day | ORAL | Status: DC
  Administered 2018-10-01: 400 mg via ORAL
  Filled 2018-10-01: qty 1

## 2018-10-01 MED ORDER — MAGNESIUM OXIDE 400 (241.3 MG) MG PO TABS: 400.0000 mg | ORAL_TABLET | Freq: Every day | ORAL | 0 refills | Status: DC

## 2018-10-01 MED ORDER — POLYSACCHARIDE IRON COMPLEX 150 MG PO CAPS: 150.0000 mg | ORAL_CAPSULE | Freq: Every day | ORAL | Status: DC

## 2018-10-01 MED ORDER — IBUPROFEN 800 MG PO TABS: 800.0000 mg | ORAL_TABLET | Freq: Three times a day (TID) | ORAL | 0 refills | Status: DC | PRN

## 2018-10-01 NOTE — Progress Notes (Signed)
POSTOPERATIVE DAY # 3 S/P CS - classical with hx myomectomy / intraoperative hemorrhage  Subjective:  reports feeling ok - little sore and tired / ready to go home tolerating po intake / no nausea / no vomiting / + flatus / no BM pain controlled withMotrin and oxycodone up ad lib / voiding QS  Newborn Breast   Objective: VS: BP 121/61   Pulse 83   Temp 98.1 F (36.7 C) (Oral)   Resp 18   Ht 5\' 2"  (1.575 m)   Wt 99.2 kg   LMP 12/19/2017 (Approximate)   SpO2 99%   Breastfeeding Unknown   BMI 40.00 kg/m        LABS:  Recent Labs    09/29/18 0556 09/29/18 1946  WBC 19.6* 19.7*  HGB 5.5* 7.6*  PLT 103* 104*    Bloodtype: --/--/O POS, O POS (07/14 0528)  Rubella: Immune (12/26 0000)                                           I&O: Intake/Output      07/16 0701 - 07/17 0700 07/17 0701 - 07/18 0700   I.V. (mL/kg) 2005.7 (20.2)    Blood     Other     IV Piggyback 104.7    Total Intake(mL/kg) 2110.3 (21.3)    Urine (mL/kg/hr)     Total Output     Net +2110.3         Physical Exam: alert and oriented X3 without any distress or pain lung fields are clear and unlabored heart rate regular / normal rhythm / no mumurs abdomen soft, non-tender, non-distended  / bowel sounds are active uterine fundus firm, non-tender, Ueven surgical incision assessed with honeycomb dressing intact without drainage lochia: light extremities: trace edema, no calf pain or tenderness  Assessment / Plan:        POD # 3 S/P CS - classical due to hx myomectomy Immediate PP / intraoperative hemorrhage - s/p transfusion and iron infusion routine postoperative care  DC home - WOB instructions reviewed  Artelia Laroche CNM, MSN, [facility] 10/01/2018, 8:46 AM

## 2018-10-01 NOTE — Discharge Summary (Addendum)
OB Discharge Summary  Patient Name: Cheyenne Keller DOB: 12/04/1980 MRN: [id number]  Date of admission: 09/28/2018 Intrauterine pregnancy: [redacted]w[redacted]d   Admitting diagnosis: Previous Myomectomy  Date of discharge: 10/01/2018    Discharge diagnosis: Term Pregnancy Delivered and Anemia-postoperative   Prenatal history: G3P1021   EDC : 10/19/2018, by Other Basis  Prenatal care at [facility]  Primary provider : Matisse Keller Prenatal course complicated by AMA (33), IDA, hx myomectomy  Prenatal Labs: ABO, Rh: --/--/O POS, O POS (07/14 0528)  Antibody: NEG (07/14 0528) Rubella: Immune (12/26 0000)  RPR: Non Reactive (07/14 0512)  HBsAg: Negative (12/26 0000)  HIV: Non-reactive (12/26 0000)  GBS:   negative                                  Hospital course:  Sceduled C/S   38 y.o. yo G3P1021 at [redacted]w[redacted]d was admitted to the hospital 09/28/2018 for scheduled cesarean section with the following indication:Prior Uterine Surgery - Myomectomy.  Membrane Rupture Time/Date: 8:03 AM ,09/28/2018  Patient delivered a Viable infant.09/28/2018  Details of operation can be found in separate operative note.    Patient had severe anemia s/p intraoperative hemorrhage - received 2 units PRBC and iron infision. She is ambulating, tolerating a regular diet, passing flatus, and urinating well. Patient is discharged home in stable condition on  10/01/18        Delivering PROVIDER: Daina Cara                                                            Complications: [id number]  Newborn Data: Live born female  Birth Weight: 7 lb 6.2 oz (3350 g) APGAR: 6, 9  Newborn Delivery   Birth date/time: 09/28/2018 08:05:00 Delivery type: C-Section, Low Transverse Trial of labor: No C-section categorization: Primary     Baby Feeding: Breast Disposition:home with mother  Post partum procedures:iron infusion Labs: Lab Results  Component Value Date   WBC 19.7 (H) 09/29/2018   HGB  7.6 (L) 09/29/2018   HCT 22.4 (L) 09/29/2018   MCV 89.6 09/29/2018   PLT 104 (L) 09/29/2018   No flowsheet data found. Physical Exam @ time of discharge:  Vitals:   09/30/18 0544 09/30/18 1359 09/30/18 2200 10/01/18 0522  BP: 115/64 108/62 113/61 121/61  Pulse: 92 (!) 102 (!) 102 83  Resp: 18 17 20 18   Temp: 98.4 F (36.9 C) 98.2 F (36.8 C) 97.8 F (36.6 C) 98.1 F (36.7 C)  TempSrc: Oral Oral Axillary Oral  SpO2:  98%  99%  Weight:      Height:       general: alert, cooperative and no distress lochia: appropriate uterine fundus: firm incision: Healing well with no significant drainage extremities: DVT Evaluation: No evidence of DVT seen on physical exam.  Discharge instructions:  "Baby and Me Booklet" and [place] Booklet Discharge Medications:  Allergies as of 10/01/2018      Reactions   Cinnamon    Tongue rash      Medication List    TAKE these medications   cholecalciferol 25 MCG (1000 UT) tablet Commonly known as: VITAMIN D Take 2,000 Units by mouth at bedtime.   ferrous  sulfate 324 MG Tbec Take 324 mg by mouth 3 (three) times daily.   ibuprofen 800 MG tablet Commonly known as: ADVIL Take 1 tablet (800 mg total) by mouth every 8 (eight) hours as needed.   magnesium oxide 400 (241.3 Mg) MG tablet Commonly known as: MAG-OX Take 1 tablet (400 mg total) by mouth daily.   multivitamin-prenatal 27-0.8 MG Tabs tablet Take 1 tablet by mouth at bedtime.   oxyCODONE 5 MG immediate release tablet Commonly known as: Oxy IR/ROXICODONE Take 1 tablet (5 mg total) by mouth every 6 (six) hours as needed for up to 5 days for moderate pain.   Theratears 0.25 % Soln Generic drug: Carboxymethylcellulose Sodium Place 1 drop into both eyes 3 (three) times daily as needed (dry/irritated eyes.).   vitamin C 500 MG tablet Commonly known as: ASCORBIC ACID Take 500 mg by mouth 2 (two) times a day.            Discharge Care Instructions  (From admission, onward)          Start     Ordered   10/01/18 0000  Discharge wound care:    Comments: Leave honeycomb in place for 5 days - remove if get wet in shower. Leave steri-strips in place x 2 weeks. Keep incision clean and dry   10/01/18 0955         Diet: routine diet Activity: Advance as tolerated. Pelvic rest x 6 weeks.  Follow up:1 week for #2 iron infusion / 6 week for routine PP visit  Signed: Artelia Laroche CNM, MSN, [facility] 10/01/2018, 9:56 AM

## 2018-10-01 NOTE — Progress Notes (Signed)
AVS printed and given to patient. Pt discharged with significant other in room with discharge instructions. Pt informed to call for follow up appointment in a week for Iron infusion and to set up six weeks postpartum visit. Pt informed to pick up prescriptions at the address listed in the AVS. Pt verbalized understanding and no further questions given.

## 2018-10-02 LAB — BPAM RBC
Blood Product Expiration Date: 202008142359
Blood Product Expiration Date: 202008142359
Blood Product Expiration Date: 202008142359
ISSUE DATE / TIME: 202007151015
ISSUE DATE / TIME: 202007151418
Unit Type and Rh: 5100
Unit Type and Rh: 5100
Unit Type and Rh: 5100

## 2018-10-02 LAB — TYPE AND SCREEN
ABO/RH(D): O POS
Antibody Screen: NEGATIVE
Unit division: 0
Unit division: 0
Unit division: 0

## 2018-10-12 ENCOUNTER — Encounter (HOSPITAL_COMMUNITY): Payer: Self-pay | Admitting: Obstetrics and Gynecology

## 2019-06-23 ENCOUNTER — Ambulatory Visit: Payer: Commercial Managed Care - PPO | Attending: Internal Medicine

## 2019-06-23 DIAGNOSIS — Z23 Encounter for immunization: Secondary | ICD-10-CM

## 2019-06-23 NOTE — Progress Notes (Signed)
   Covid-19 Vaccination Clinic  Name:  Cheyenne Keller    MRN: [id number] DOB: 29-Jan-1981  06/23/2019  [name] was observed post Covid-19 immunization for 15 minutes without incident. She was provided with Vaccine Information Sheet and instruction to access the V-Safe system.   [name] was instructed to call 911 with any severe reactions post vaccine: Marland Kitchen Difficulty breathing  . Swelling of face and throat  . A fast heartbeat  . A bad rash all over body  . Dizziness and weakness   Immunizations Administered    Name Date Dose VIS Date Route   Pfizer COVID-19 Vaccine 06/23/2019  2:25 PM 0.3 mL 02/25/2019 Intramuscular   Manufacturer: [place]   Lot: [id number]   [place]: [id number]

## 2019-07-18 ENCOUNTER — Ambulatory Visit: Payer: Commercial Managed Care - PPO | Attending: Internal Medicine

## 2019-07-18 DIAGNOSIS — Z23 Encounter for immunization: Secondary | ICD-10-CM

## 2019-07-18 NOTE — Progress Notes (Signed)
   Covid-19 Vaccination Clinic  Name:  Cheyenne Keller    MRN: [id number] DOB: 04-May-1980  07/18/2019  [name] was observed post Covid-19 immunization for 15 minutes without incident. She was provided with Vaccine Information Sheet and instruction to access the V-Safe system.   [name] was instructed to call 911 with any severe reactions post vaccine: Marland Kitchen Difficulty breathing  . Swelling of face and throat  . A fast heartbeat  . A bad rash all over body  . Dizziness and weakness   Immunizations Administered    Name Date Dose VIS Date Route   Pfizer COVID-19 Vaccine 07/18/2019  2:23 PM 0.3 mL 05/11/2018 Intramuscular   Manufacturer: [place]   Lot: [id number]   [place]: [id number]

## 2019-10-14 ENCOUNTER — Ambulatory Visit: Payer: Commercial Managed Care - PPO | Admitting: Internal Medicine

## 2020-01-20 ENCOUNTER — Other Ambulatory Visit: Payer: Self-pay

## 2020-01-20 ENCOUNTER — Encounter: Payer: Self-pay | Admitting: Internal Medicine

## 2020-01-20 ENCOUNTER — Ambulatory Visit: Payer: Commercial Managed Care - PPO | Admitting: Internal Medicine

## 2020-01-20 DIAGNOSIS — D509 Iron deficiency anemia, unspecified: Secondary | ICD-10-CM | POA: Insufficient documentation

## 2020-01-20 NOTE — Patient Instructions (Signed)
-  Nice seeing you today!!  -Come back to see me as needed.

## 2020-01-20 NOTE — Progress Notes (Signed)
New Patient Office Visit     This visit occurred during the SARS-CoV-2 public health emergency.  Safety protocols were in place, including screening questions prior to the visit, additional usage of staff PPE, and extensive cleaning of exam room while observing appropriate contact time as indicated for disinfecting solutions.    CC/Reason for Visit: Establish care, discuss chronic medical conditions Previous PCP: In [place] Last Visit: Unknown  HPI: Cheyenne Keller is a 39 y.o. female who is coming in today for the above mentioned reasons. Past Medical History is significant for: Obesity, history of iron deficiency anemia currently on iron supplementation, history of fibroids status post myomectomy.  She is currently [redacted] weeks pregnant, has already established care with local GYN.  She has a 16.67-year-old daughter, is married, she does not smoke, she does not drink, her family history is significant for a mother with a stroke and a father with congestive heart failure and chronic kidney disease.  She has no acute complaints today other than expected fatigue with first trimester pregnancy.   Past Medical/Surgical History: Past Medical History:  Diagnosis Date  . Fibroid   . Iron deficiency anemia   . PCOS (polycystic ovarian syndrome)     Past Surgical History:  Procedure Laterality Date  . CESAREAN SECTION N/A 09/28/2018   Procedure: Primary CESAREAN SECTION;  Surgeon: Servando Salina, MD;  Location: [facility] LD [facility]S;  Service: Obstetrics;  Laterality: N/A;  EDD: 10/19/18  . MYOMECTOMY      Social History:  reports that she has never smoked. She has never used smokeless tobacco. She reports previous alcohol use. She reports that she does not use drugs.  Allergies: Allergies  Allergen Reactions  . Cinnamon     Tongue rash    Family History:  Family History  Problem Relation Age of Onset  . Stroke Mother   . Heart disease Father   . Kidney disease Father       Current Outpatient Medications:  .  cholecalciferol (VITAMIN D) 25 MCG (1000 UT) tablet, Take 2,000 Units by mouth at bedtime., Disp: , Rfl:  .  ferrous sulfate 324 MG TBEC, Take 324 mg by mouth 3 (three) times daily., Disp: , Rfl:  .  ibuprofen (ADVIL) 800 MG tablet, Take 1 tablet (800 mg total) by mouth every 8 (eight) hours as needed., Disp: 30 tablet, Rfl: 0 .  Prenatal Vit-Fe Fumarate-FA (MULTIVITAMIN-PRENATAL) 27-0.8 MG TABS tablet, Take 1 tablet by mouth at bedtime. , Disp: , Rfl:  .  vitamin C (ASCORBIC ACID) 500 MG tablet, Take 500 mg by mouth 2 (two) times a day., Disp: , Rfl:   Review of Systems:  Constitutional: Denies fever, chills, diaphoresis, appetite change. HEENT: Denies photophobia, eye pain, redness, hearing loss, ear pain, congestion, sore throat, rhinorrhea, sneezing, mouth sores, trouble swallowing, neck pain, neck stiffness and tinnitus.   Respiratory: Denies SOB, DOE, cough, chest tightness,  and wheezing.   Cardiovascular: Denies chest pain, palpitations and leg swelling.  Gastrointestinal: Denies nausea, vomiting, abdominal pain, diarrhea, constipation, blood in stool and abdominal distention.  Genitourinary: Denies dysuria, urgency, frequency, hematuria, flank pain and difficulty urinating.  Endocrine: Denies: hot or cold intolerance, sweats, changes in hair or nails, polyuria, polydipsia. Musculoskeletal: Denies myalgias, back pain, joint swelling, arthralgias and gait problem.  Skin: Denies pallor, rash and wound.  Neurological: Denies dizziness, seizures, syncope, weakness, light-headedness, numbness and headaches.  Hematological: Denies adenopathy. Easy bruising, personal or family bleeding history  Psychiatric/Behavioral: Denies suicidal ideation, mood  changes, confusion, nervousness, sleep disturbance and agitation    Physical Exam: Vitals:   01/20/20 0915  BP: (!) 114/58  Pulse: 95  Temp: 98.9 F (37.2 C)  TempSrc: Oral  SpO2: 99%  Weight:  212 lb 4.8 oz (96.3 kg)  Height: 5' 2.75" (1.594 m)   Body mass index is 37.91 kg/m.   Constitutional: NAD, calm, comfortable Eyes: PERRL, lids and conjunctivae normal, wears corrective lenses ENMT: Mucous membranes are moist. Respiratory: clear to auscultation bilaterally, no wheezing, no crackles. Normal respiratory effort. No accessory muscle use.  Cardiovascular: Regular rate and rhythm, no murmurs / rubs / gallops. No extremity edema. Neurologic: Grossly intact and nonfocal Psychiatric: Normal judgment and insight. Alert and oriented x 3. Normal mood.    Impression and Plan:  Iron deficiency anemia, unspecified iron deficiency anemia type  -Currently on iron supplementation and followed by OB/GYN.  She knows to return to me after delivery for routine medical care.    Patient Instructions  -Nice seeing you today!!  -Come back to see me as needed.     Lelon Frohlich, MD [facility] at [facility]

## 2020-02-16 ENCOUNTER — Other Ambulatory Visit: Payer: Self-pay

## 2020-02-28 ENCOUNTER — Other Ambulatory Visit: Payer: Self-pay | Admitting: Obstetrics and Gynecology

## 2020-02-28 ENCOUNTER — Encounter: Payer: Self-pay | Admitting: *Deleted

## 2020-02-28 DIAGNOSIS — Z3A19 19 weeks gestation of pregnancy: Secondary | ICD-10-CM

## 2020-02-28 DIAGNOSIS — Z363 Encounter for antenatal screening for malformations: Secondary | ICD-10-CM

## 2020-02-28 DIAGNOSIS — R772 Abnormality of alphafetoprotein: Secondary | ICD-10-CM

## 2020-03-01 ENCOUNTER — Other Ambulatory Visit: Payer: Self-pay

## 2020-03-01 ENCOUNTER — Other Ambulatory Visit: Payer: Self-pay | Admitting: Obstetrics and Gynecology

## 2020-03-01 ENCOUNTER — Ambulatory Visit: Payer: Commercial Managed Care - PPO | Attending: Obstetrics and Gynecology

## 2020-03-01 ENCOUNTER — Ambulatory Visit: Payer: Commercial Managed Care - PPO | Admitting: *Deleted

## 2020-03-01 ENCOUNTER — Ambulatory Visit: Payer: Commercial Managed Care - PPO

## 2020-03-01 VITALS — BP 129/60 | HR 93

## 2020-03-01 DIAGNOSIS — Z363 Encounter for antenatal screening for malformations: Secondary | ICD-10-CM | POA: Diagnosis present

## 2020-03-01 DIAGNOSIS — Z3A19 19 weeks gestation of pregnancy: Secondary | ICD-10-CM | POA: Diagnosis present

## 2020-03-01 DIAGNOSIS — R772 Abnormality of alphafetoprotein: Secondary | ICD-10-CM | POA: Diagnosis not present

## 2020-03-01 DIAGNOSIS — O09529 Supervision of elderly multigravida, unspecified trimester: Secondary | ICD-10-CM | POA: Insufficient documentation

## 2020-03-02 ENCOUNTER — Other Ambulatory Visit: Payer: Self-pay | Admitting: *Deleted

## 2020-03-17 HISTORY — PX: VAGINAL HYSTERECTOMY: SUR661

## 2020-03-29 ENCOUNTER — Ambulatory Visit: Payer: Commercial Managed Care - PPO | Attending: Obstetrics and Gynecology

## 2020-03-29 ENCOUNTER — Encounter: Payer: Self-pay | Admitting: *Deleted

## 2020-03-29 ENCOUNTER — Ambulatory Visit: Payer: Commercial Managed Care - PPO | Admitting: *Deleted

## 2020-03-29 ENCOUNTER — Other Ambulatory Visit: Payer: Self-pay

## 2020-03-29 VITALS — BP 130/58 | HR 101

## 2020-03-29 DIAGNOSIS — O09522 Supervision of elderly multigravida, second trimester: Secondary | ICD-10-CM | POA: Insufficient documentation

## 2020-03-29 DIAGNOSIS — Z363 Encounter for antenatal screening for malformations: Secondary | ICD-10-CM

## 2020-03-29 DIAGNOSIS — O09812 Supervision of pregnancy resulting from assisted reproductive technology, second trimester: Secondary | ICD-10-CM | POA: Diagnosis not present

## 2020-03-29 DIAGNOSIS — O43212 Placenta accreta, second trimester: Secondary | ICD-10-CM

## 2020-03-29 DIAGNOSIS — Z361 Encounter for antenatal screening for raised alphafetoprotein level: Secondary | ICD-10-CM | POA: Diagnosis not present

## 2020-03-29 DIAGNOSIS — O34219 Maternal care for unspecified type scar from previous cesarean delivery: Secondary | ICD-10-CM

## 2020-03-29 DIAGNOSIS — O281 Abnormal biochemical finding on antenatal screening of mother: Secondary | ICD-10-CM | POA: Diagnosis not present

## 2020-03-29 DIAGNOSIS — Z3A23 23 weeks gestation of pregnancy: Secondary | ICD-10-CM | POA: Insufficient documentation

## 2020-10-02 ENCOUNTER — Telehealth (INDEPENDENT_AMBULATORY_CARE_PROVIDER_SITE_OTHER): Payer: Commercial Managed Care - PPO | Admitting: Internal Medicine

## 2020-10-02 DIAGNOSIS — J0100 Acute maxillary sinusitis, unspecified: Secondary | ICD-10-CM | POA: Diagnosis not present

## 2020-10-02 MED ORDER — AMOXICILLIN-POT CLAVULANATE 875-125 MG PO TABS: 1.0000 | ORAL_TABLET | Freq: Two times a day (BID) | ORAL | 0 refills | Status: AC

## 2020-10-02 NOTE — Progress Notes (Signed)
Virtual Visit via Video Note  I connected with Cheyenne Keller on 10/02/20 at  4:00 PM EDT by a video enabled telemedicine application and verified that I am speaking with the correct person using two identifiers.  Location patient: home Location provider: work office Persons participating in the virtual visit: patient, provider  I discussed the limitations of evaluation and management by telemedicine and the availability of in person appointments. The patient expressed understanding and agreed to proceed.   HPI: She has been acutely sick for about a week.  About a week prior to that her daycare aged daughter was sick with pinkeye and URI symptoms.  About a week ago she started having sore throat that evolved into trach yellow mucus that was blood, cough, pain with coughing, she has significant bilateral ear pain and now has developed conjunctivitis.  Her T-max today was 98.5.  She denies shortness of breath or chest pain.  She took a COVID test today that resulted negative.  She has been very fatigued.  She has a 78-month-old and a toddler.   ROS: Constitutional: Denies fever, chills, diaphoresis. HEENT: Denies photophobia, eye pain,  mouth sores, neck pain, neck stiffness and tinnitus.   Respiratory: Denies SOB, DOE, cough, chest tightness,  and wheezing.   Cardiovascular: Denies chest pain, palpitations and leg swelling.  Gastrointestinal: Denies nausea, vomiting, abdominal pain, diarrhea, constipation, blood in stool and abdominal distention.  Genitourinary: Denies dysuria, urgency, frequency, hematuria, flank pain and difficulty urinating.  Endocrine: Denies: hot or cold intolerance, sweats, changes in hair or nails, polyuria, polydipsia. Musculoskeletal: Denies myalgias, back pain, joint swelling, arthralgias and gait problem.  Skin: Denies pallor, rash and wound.  Neurological: Denies dizziness, seizures, syncope, weakness, light-headedness, numbness and headaches.   Hematological: Denies adenopathy. Easy bruising, personal or family bleeding history  Psychiatric/Behavioral: Denies suicidal ideation, mood changes, confusion, nervousness, sleep disturbance and agitation   Past Medical History:  Diagnosis Date   Fibroid    Iron deficiency anemia    PCOS (polycystic ovarian syndrome)    Pelvic adhesions     Past Surgical History:  Procedure Laterality Date   CESAREAN SECTION N/A 09/28/2018   Procedure: Primary CESAREAN SECTION;  Surgeon: Servando Salina, MD;  Location: [facility] LD [facility]S;  Service: Obstetrics;  Laterality: N/A;  EDD: 10/19/18   MYOMECTOMY      Family History  Problem Relation Age of Onset   Stroke Mother    Heart disease Father    Kidney disease Father     SOCIAL HX:   reports that she has never smoked. She has never used smokeless tobacco. She reports previous alcohol use. She reports that she does not use drugs.   Current Outpatient Medications:    amoxicillin-clavulanate (AUGMENTIN) 875-125 MG tablet, Take 1 tablet by mouth 2 (two) times daily for 7 days., Disp: 14 tablet, Rfl: 0   cholecalciferol (VITAMIN D) 25 MCG (1000 UT) tablet, Take 2,000 Units by mouth at bedtime., Disp: , Rfl:    ferrous sulfate 324 MG TBEC, Take 324 mg by mouth 3 (three) times daily., Disp: , Rfl:    ibuprofen (ADVIL) 800 MG tablet, Take 1 tablet (800 mg total) by mouth every 8 (eight) hours as needed., Disp: 30 tablet, Rfl: 0   Prenatal Vit-Fe Fumarate-FA (MULTIVITAMIN-PRENATAL) 27-0.8 MG TABS tablet, Take 1 tablet by mouth at bedtime. , Disp: , Rfl:    vitamin C (ASCORBIC ACID) 500 MG tablet, Take 500 mg by mouth 2 (two) times a day. (Patient  not taking: Reported on 10/02/2020), Disp: , Rfl:   EXAM:   VITALS per patient if applicable: T-max 30.1  GENERAL: alert, oriented, appears acutely ill, sounds congested  HEENT: atraumatic, conjunttiva clear, no obvious abnormalities on inspection of external nose and ears  NECK: normal movements of the head  and neck  LUNGS: on inspection no signs of respiratory distress, breathing rate appears normal, no obvious gross increased work of breathing, gasping or wheezing  CV: no obvious cyanosis  MS: moves all visible extremities without noticeable abnormality  PSYCH/NEURO: pleasant and cooperative, no obvious depression or anxiety, speech and thought processing grossly intact  ASSESSMENT AND PLAN:   Acute non-recurrent maxillary sinusitis  -Based on her symptoms, I am suspicious that this has evolved into a bacterial infection given her thick yellow mucus that is at times blood-tinged. -I am not excluding acute bronchitis/pneumonia or otitis media. -I will treat her with Augmentin for 1 week. -I have advised that she redo her COVID test in 2 days. -She will contact me if her COVID test is positive or if she fails to improve after 7 to 10 days.  -Time spent: 30 minutes reviewing chart, interviewing patient and formulating plan of care.     I discussed the assessment and treatment plan with the patient. The patient was provided an opportunity to ask questions and all were answered. The patient agreed with the plan and demonstrated an understanding of the instructions.   The patient was advised to call back or seek an in-person evaluation if the symptoms worsen or if the condition fails to improve as anticipated.    Lelon Frohlich, MD  [facility] at [facility]

## 2020-10-18 ENCOUNTER — Encounter: Payer: Self-pay | Admitting: Internal Medicine

## 2020-10-18 ENCOUNTER — Telehealth (INDEPENDENT_AMBULATORY_CARE_PROVIDER_SITE_OTHER): Payer: Commercial Managed Care - PPO | Admitting: Internal Medicine

## 2020-10-18 VITALS — Wt 200.0 lb

## 2020-10-18 DIAGNOSIS — J069 Acute upper respiratory infection, unspecified: Secondary | ICD-10-CM | POA: Diagnosis not present

## 2020-10-18 NOTE — Progress Notes (Signed)
Virtual Visit via Video Note  I connected with Cheyenne Keller on 10/18/20 at  4:00 PM EDT by a video enabled telemedicine application and verified that I am speaking with the correct person using two identifiers.  Location patient: home Location provider: work office Persons participating in the virtual visit: patient, provider  I discussed the limitations of evaluation and management by telemedicine and the availability of in person appointments. The patient expressed understanding and agreed to proceed.   HPI: I saw her on July 19 during a virtual visit for what was thought to possibly be acute bacterial sinusitis/bronchitis and she was given Augmentin.  At that time her COVID test was negative.  Over the past 2 to 3 days she has had a resurgence of a dry cough with ear pain bilaterally and a sore throat.  Her daughter has just recently started going to daycare and this is where all of the family's problems have started.  She has not yet tested for COVID this time.  She took some ibuprofen with relief.   ROS: Constitutional: Denies fever, chills, diaphoresis, appetite change. HEENT: Denies photophobia, eye pain, redness,mouth sores, trouble swallowing, neck pain, neck stiffness and tinnitus.   Respiratory: Denies SOB, DOE, chest tightness,  and wheezing.   Cardiovascular: Denies chest pain, palpitations and leg swelling.  Gastrointestinal: Denies nausea, vomiting, abdominal pain, diarrhea, constipation, blood in stool and abdominal distention.  Genitourinary: Denies dysuria, urgency, frequency, hematuria, flank pain and difficulty urinating.  Endocrine: Denies: hot or cold intolerance, sweats, changes in hair or nails, polyuria, polydipsia. Musculoskeletal: Denies myalgias, back pain, joint swelling, arthralgias and gait problem.  Skin: Denies pallor, rash and wound.  Neurological: Denies dizziness, seizures, syncope, weakness, light-headedness, numbness and headaches.   Hematological: Denies adenopathy. Easy bruising, personal or family bleeding history  Psychiatric/Behavioral: Denies suicidal ideation, mood changes, confusion, nervousness, sleep disturbance and agitation   Past Medical History:  Diagnosis Date   Fibroid    Iron deficiency anemia    PCOS (polycystic ovarian syndrome)    Pelvic adhesions     Past Surgical History:  Procedure Laterality Date   CESAREAN SECTION N/A 09/28/2018   Procedure: Primary CESAREAN SECTION;  Surgeon: Servando Salina, MD;  Location: [facility] LD [facility]S;  Service: Obstetrics;  Laterality: N/A;  EDD: 10/19/18   MYOMECTOMY      Family History  Problem Relation Age of Onset   Stroke Mother    Heart disease Father    Kidney disease Father     SOCIAL HX:   reports that she has never smoked. She has never used smokeless tobacco. She reports previous alcohol use. She reports that she does not use drugs.   Current Outpatient Medications:    cholecalciferol (VITAMIN D) 25 MCG (1000 UT) tablet, Take 2,000 Units by mouth at bedtime., Disp: , Rfl:    ferrous sulfate 324 MG TBEC, Take 324 mg by mouth 3 (three) times daily., Disp: , Rfl:    ibuprofen (ADVIL) 800 MG tablet, Take 1 tablet (800 mg total) by mouth every 8 (eight) hours as needed., Disp: 30 tablet, Rfl: 0   Prenatal Vit-Fe Fumarate-FA (MULTIVITAMIN-PRENATAL) 27-0.8 MG TABS tablet, Take 1 tablet by mouth at bedtime. , Disp: , Rfl:    vitamin C (ASCORBIC ACID) 500 MG tablet, Take 500 mg by mouth 2 (two) times a day., Disp: , Rfl:   EXAM:   VITALS per patient if applicable: None reported  GENERAL: alert, oriented, appears well and in no acute distress  HEENT: atraumatic, conjunttiva clear, no obvious abnormalities on inspection of external nose and ears, wears corrective lenses  NECK: normal movements of the head and neck  LUNGS: on inspection no signs of respiratory distress, breathing rate appears normal, no obvious gross increased work of breathing, gasping  or wheezing  CV: no obvious cyanosis  MS: moves all visible extremities without noticeable abnormality  PSYCH/NEURO: pleasant and cooperative, no obvious depression or anxiety, speech and thought processing grossly intact  ASSESSMENT AND PLAN:   Viral upper respiratory tract infection -I have advised that she take a COVID test, she will notify me with results. -I am not strongly convinced that she needs another round of antibiotic therapy, as she was given Augmentin about 2-1/2 weeks ago. -For now have advised OTC remedies such as guaifenesin and antihistamines, pain relievers. -She will try make an appointment for next week if COVID test is negative and symptoms persist.    I discussed the assessment and treatment plan with the patient. The patient was provided an opportunity to ask questions and all were answered. The patient agreed with the plan and demonstrated an understanding of the instructions.   The patient was advised to call back or seek an in-person evaluation if the symptoms worsen or if the condition fails to improve as anticipated.    Lelon Frohlich, MD  [facility] at [facility]

## 2021-01-18 ENCOUNTER — Other Ambulatory Visit: Payer: Self-pay | Admitting: Family Medicine

## 2021-01-18 ENCOUNTER — Encounter: Payer: Self-pay | Admitting: Family Medicine

## 2021-01-18 ENCOUNTER — Telehealth (INDEPENDENT_AMBULATORY_CARE_PROVIDER_SITE_OTHER): Payer: Commercial Managed Care - PPO | Admitting: Family Medicine

## 2021-01-18 VITALS — Ht 62.75 in

## 2021-01-18 DIAGNOSIS — J029 Acute pharyngitis, unspecified: Secondary | ICD-10-CM | POA: Diagnosis not present

## 2021-01-18 DIAGNOSIS — H9202 Otalgia, left ear: Secondary | ICD-10-CM | POA: Diagnosis not present

## 2021-01-18 LAB — POCT RAPID STREP A (OFFICE): Rapid Strep A Screen: NEGATIVE

## 2021-01-18 MED ORDER — MAGIC MOUTHWASH W/LIDOCAINE: 5.0000 mL | Freq: Three times a day (TID) | ORAL | 0 refills | Status: DC | PRN

## 2021-01-18 NOTE — Progress Notes (Signed)
Virtual Visit via Video Note I connected with Cheyenne Keller on 01/18/21 by a video enabled telemedicine application and verified that I am speaking with the correct person using two identifiers.  Location patient: home Location provider:work office Persons participating in the virtual visit: patient, provider  I discussed the limitations of evaluation and management by telemedicine and the availability of in person appointments. The patient expressed understanding and agreed to proceed.  Chief Complaint  Patient presents with   Sore Throat        Ear Pain   HPI: Ms. Banbury is a otherwise healthy 40 yo female c/o 4 days of intermittent sore throat and left earache. Sore throat is exacerbated by swallowing, L>>R. She has noted some erythema on left side of her throat. Pain is "bad."  Left earache is also exacerbated by swallowing. She has not noted ear drainage or changes in hearing.  Negative for fever,chills, wheezing,SOB,N/V,abdominal pain,changes in bowel habits,or skin rash. Negative for dysphagia or stridor. Problems are stable.  Until a week ago she was coughing and had nasal congestion and rhinorrhea.These symptoms have resolved.  Several members of her family had URI's. No known hx of allergies or recent travel.  She is taking Dayquil and Motrin.  ROS: See pertinent positives and negatives per HPI.  Past Medical History:  Diagnosis Date   Fibroid    Iron deficiency anemia    PCOS (polycystic ovarian syndrome)    Pelvic adhesions    Past Surgical History:  Procedure Laterality Date   CESAREAN SECTION N/A 09/28/2018   Procedure: Primary CESAREAN SECTION;  Surgeon: Servando Salina, MD;  Location: [facility] LD [facility]S;  Service: Obstetrics;  Laterality: N/A;  EDD: 10/19/18   MYOMECTOMY     Family History  Problem Relation Age of Onset   Stroke Mother    Heart disease Father    Kidney disease Father    Social History   Socioeconomic History   Marital  status: Married    Spouse name: Not on file   Number of children: Not on file   Years of education: Not on file   Highest education level: Not on file  Occupational History   Not on file  Tobacco Use   Smoking status: Never   Smokeless tobacco: Never  Vaping Use   Vaping Use: Never used  Substance and Sexual Activity   Alcohol use: Not Currently   Drug use: Never   Sexual activity: Not on file  Other Topics Concern   Not on file  Social History Narrative   Not on file   Social Determinants of Health   Financial Resource Strain: Not on file  Food Insecurity: Not on file  Transportation Needs: Not on file  Physical Activity: Not on file  Stress: Not on file  Social Connections: Not on file  Intimate Partner Violence: Not on file   Current Outpatient Medications:    cholecalciferol (VITAMIN D) 25 MCG (1000 UT) tablet, Take 2,000 Units by mouth at bedtime., Disp: , Rfl:    ferrous sulfate 324 MG TBEC, Take 324 mg by mouth 3 (three) times daily., Disp: , Rfl:    ibuprofen (ADVIL) 800 MG tablet, Take 1 tablet (800 mg total) by mouth every 8 (eight) hours as needed., Disp: 30 tablet, Rfl: 0   Prenatal Vit-Fe Fumarate-FA (MULTIVITAMIN-PRENATAL) 27-0.8 MG TABS tablet, Take 1 tablet by mouth at bedtime. , Disp: , Rfl:    vitamin C (ASCORBIC ACID) 500 MG tablet, Take 500 mg by mouth 2 (  two) times a day., Disp: , Rfl:   EXAM:  VITALS per patient if applicable:Ht 5' 9.40" (1.594 m)   LMP 12/19/2017 (Approximate)   Breastfeeding No   BMI 35.71 kg/m   GENERAL: alert, oriented, appears well and in no acute distress  HEENT: atraumatic, conjunctiva clear, no obvious abnormalities on inspection of external nose and ears.  Came to the parking lot: Mild pharyngeal erythema, no edema or exudate, hypertrophic tonsils 2+, symmetric. Uvula is centered, not edematous. Ears: TM's with no erythema or bulged bilateral.  Ear canal: No erythema,no cerumen, and no tenderness with otoscopic exam  or by pressing tragus.  NECK: normal movements of the head and neck No enlarged lymph nodes, mild tenderness when palpating left submandibular gland.  LUNGS: on inspection no signs of respiratory distress, breathing rate appears normal, no obvious gross SOB, gasping or wheezing  CV: no obvious cyanosis  MS: moves all visible extremities without noticeable abnormality  PSYCH/NEURO: pleasant and cooperative, no obvious depression, + Anxious.Speech and thought processing grossly intact  ASSESSMENT AND PLAN:  Discussed the following assessment and plan:  Sore throat - Plan: POC Rapid Strep A, Culture, Group A Strep, magic mouthwash w/lidocaine SOLN She is reporting hx of hypertrophic tonsils. We discussed possible etiologies. Hx and examination today do not suggest a serious process. Viral vs allergic. Rapid strep negative. We will follow strep Cx. Monitor for new symptoms. Symptomatic treatment with mouthwash with lidocaine recommend. Instructed about warning signs.  Earache on left HEENT examination negative. Pain seems to be radiated from throat. ? Eustachian tube dysfunction.  I discussed the assessment and treatment plan with the patient. The patient was provided an opportunity to ask questions and all were answered. The patient agreed with the plan and demonstrated an understanding of the instructions.  Return if symptoms worsen or fail to improve, for 2 weeks if problem persist.. Betty G. Martinique, MD  [facility]. [place] office.

## 2021-01-21 ENCOUNTER — Other Ambulatory Visit: Payer: Self-pay

## 2021-01-21 DIAGNOSIS — J029 Acute pharyngitis, unspecified: Secondary | ICD-10-CM

## 2021-01-21 MED ORDER — MAGIC MOUTHWASH W/LIDOCAINE: 5.0000 mL | Freq: Three times a day (TID) | ORAL | 0 refills | Status: AC | PRN

## 2021-01-23 LAB — CULTURE, GROUP A STREP: Strep A Culture: NEGATIVE

## 2021-10-10 ENCOUNTER — Other Ambulatory Visit: Payer: Self-pay | Admitting: Internal Medicine

## 2021-10-10 ENCOUNTER — Encounter: Payer: Self-pay | Admitting: Internal Medicine

## 2021-10-10 ENCOUNTER — Ambulatory Visit (INDEPENDENT_AMBULATORY_CARE_PROVIDER_SITE_OTHER): Payer: Commercial Managed Care - PPO | Admitting: Internal Medicine

## 2021-10-10 ENCOUNTER — Other Ambulatory Visit: Payer: Self-pay | Admitting: *Deleted

## 2021-10-10 VITALS — BP 110/70 | HR 79 | Temp 98.0°F | Ht 62.0 in | Wt 211.4 lb

## 2021-10-10 DIAGNOSIS — E559 Vitamin D deficiency, unspecified: Secondary | ICD-10-CM | POA: Insufficient documentation

## 2021-10-10 DIAGNOSIS — Z0000 Encounter for general adult medical examination without abnormal findings: Secondary | ICD-10-CM

## 2021-10-10 DIAGNOSIS — D509 Iron deficiency anemia, unspecified: Secondary | ICD-10-CM | POA: Diagnosis not present

## 2021-10-10 DIAGNOSIS — E669 Obesity, unspecified: Secondary | ICD-10-CM

## 2021-10-10 DIAGNOSIS — R7302 Impaired glucose tolerance (oral): Secondary | ICD-10-CM | POA: Insufficient documentation

## 2021-10-10 DIAGNOSIS — E785 Hyperlipidemia, unspecified: Secondary | ICD-10-CM

## 2021-10-10 LAB — CBC WITH DIFFERENTIAL/PLATELET
Basophils Absolute: 0 10*3/uL (ref 0.0–0.1)
Basophils Relative: 0.2 % (ref 0.0–3.0)
Eosinophils Absolute: 0.1 10*3/uL (ref 0.0–0.7)
Eosinophils Relative: 2.2 % (ref 0.0–5.0)
HCT: 33.5 % — ABNORMAL LOW (ref 36.0–46.0)
Hemoglobin: 10.9 g/dL — ABNORMAL LOW (ref 12.0–15.0)
Lymphocytes Relative: 41.3 % (ref 12.0–46.0)
Lymphs Abs: 2.5 10*3/uL (ref 0.7–4.0)
MCHC: 32.5 g/dL (ref 30.0–36.0)
MCV: 89 fl (ref 78.0–100.0)
Monocytes Absolute: 0.4 10*3/uL (ref 0.1–1.0)
Monocytes Relative: 6.7 % (ref 3.0–12.0)
Neutro Abs: 3 10*3/uL (ref 1.4–7.7)
Neutrophils Relative %: 49.6 % (ref 43.0–77.0)
Platelets: 204 10*3/uL (ref 150.0–400.0)
RBC: 3.76 Mil/uL — ABNORMAL LOW (ref 3.87–5.11)
RDW: 13.8 % (ref 11.5–15.5)
WBC: 6.1 10*3/uL (ref 4.0–10.5)

## 2021-10-10 LAB — COMPREHENSIVE METABOLIC PANEL
ALT: 10 U/L (ref 0–35)
AST: 16 U/L (ref 0–37)
Albumin: 4.2 g/dL (ref 3.5–5.2)
Alkaline Phosphatase: 82 U/L (ref 39–117)
BUN: 12 mg/dL (ref 6–23)
CO2: 28 mEq/L (ref 19–32)
Calcium: 9.4 mg/dL (ref 8.4–10.5)
Chloride: 101 mEq/L (ref 96–112)
Creatinine, Ser: 0.74 mg/dL (ref 0.40–1.20)
GFR: 99.85 mL/min (ref >60.00)
Glucose, Bld: 84 mg/dL (ref 70–99)
Potassium: 4.6 mEq/L (ref 3.5–5.1)
Sodium: 136 mEq/L (ref 135–145)
Total Bilirubin: 0.5 mg/dL (ref 0.2–1.2)
Total Protein: 8.1 g/dL (ref 6.0–8.3)

## 2021-10-10 LAB — TSH: TSH: 1.77 u[IU]/mL (ref 0.35–5.50)

## 2021-10-10 LAB — LIPID PANEL
Cholesterol: 209 mg/dL — ABNORMAL HIGH (ref 0–200)
HDL: 61.3 mg/dL (ref >39.00)
LDL Cholesterol: 129 mg/dL — ABNORMAL HIGH (ref 0–99)
NonHDL: 147.49
Total CHOL/HDL Ratio: 3
Triglycerides: 90 mg/dL (ref 0.0–149.0)
VLDL: 18 mg/dL (ref 0.0–40.0)

## 2021-10-10 LAB — HEMOGLOBIN A1C: Hgb A1c MFr Bld: 6.1 % (ref 4.6–6.5)

## 2021-10-10 LAB — VITAMIN B12: Vitamin B-12: 444 pg/mL (ref 211–911)

## 2021-10-10 LAB — VITAMIN D 25 HYDROXY (VIT D DEFICIENCY, FRACTURES): VITD: 23.1 ng/mL — ABNORMAL LOW (ref 30.00–100.00)

## 2021-10-10 MED ORDER — VITAMIN D (ERGOCALCIFEROL) 1.25 MG (50000 UNIT) PO CAPS: 50000.0000 [IU] | ORAL_CAPSULE | ORAL | 0 refills | Status: AC

## 2021-10-10 NOTE — Progress Notes (Signed)
Established Patient Office Visit     CC/Reason for Visit: Annual preventive exam  HPI: Cheyenne Keller is a 41 y.o. female who is coming in today for the above mentioned reasons. Past Medical History is significant for: Iron deficiency anemia.  Since I last saw her she has had a hysterectomy after her last pregnancy.  She saw her GYN last week.  Had mammogram there.  No longer does Pap smears.  She is due a bivalent COVID-vaccine.  She has routine eye and dental care.   Past Medical/Surgical History: Past Medical History:  Diagnosis Date   Fibroid    Iron deficiency anemia    PCOS (polycystic ovarian syndrome)    Pelvic adhesions     Past Surgical History:  Procedure Laterality Date   CESAREAN SECTION N/A 09/28/2018   Procedure: Primary CESAREAN SECTION;  Surgeon: Servando Salina, MD;  Location: [facility] LD [facility]S;  Service: Obstetrics;  Laterality: N/A;  EDD: 10/19/18   MYOMECTOMY      Social History:  reports that she has never smoked. She has never used smokeless tobacco. She reports that she does not currently use alcohol. She reports that she does not use drugs.  Allergies: Allergies  Allergen Reactions   Cinnamon     Tongue rash    Family History:  Family History  Problem Relation Age of Onset   Stroke Mother    Heart disease Father    Kidney disease Father      Current Outpatient Medications:    Prenatal Vit-Fe Fumarate-FA (MULTIVITAMIN-PRENATAL) 27-0.8 MG TABS tablet, Take 1 tablet by mouth at bedtime. , Disp: , Rfl:   Review of Systems:  Constitutional: Denies fever, chills, diaphoresis, appetite change and fatigue.  HEENT: Denies photophobia, eye pain, redness, hearing loss, ear pain, congestion, sore throat, rhinorrhea, sneezing, mouth sores, trouble swallowing, neck pain, neck stiffness and tinnitus.   Respiratory: Denies SOB, DOE, cough, chest tightness,  and wheezing.   Cardiovascular: Denies chest pain, palpitations and leg swelling.   Gastrointestinal: Denies nausea, vomiting, abdominal pain, diarrhea, constipation, blood in stool and abdominal distention.  Genitourinary: Denies dysuria, urgency, frequency, hematuria, flank pain and difficulty urinating.  Endocrine: Denies: hot or cold intolerance, sweats, changes in hair or nails, polyuria, polydipsia. Musculoskeletal: Denies myalgias, back pain, joint swelling, arthralgias and gait problem.  Skin: Denies pallor, rash and wound.  Neurological: Denies dizziness, seizures, syncope, weakness, light-headedness, numbness and headaches.  Hematological: Denies adenopathy. Easy bruising, personal or family bleeding history  Psychiatric/Behavioral: Denies suicidal ideation, mood changes, confusion, nervousness, sleep disturbance and agitation    Physical Exam: Vitals:   10/10/21 1045  BP: 110/70  Pulse: 79  Temp: 98 F (36.7 C)  TempSrc: Oral  SpO2: 99%  Weight: 211 lb 6.4 oz (95.9 kg)  Height: '5\' 2"'$  (1.575 m)    Body mass index is 38.67 kg/m.   Constitutional: NAD, calm, comfortable, obese Eyes: PERRL, lids and conjunctivae normal, wears corrective lenses ENMT: Mucous membranes are moist. Posterior pharynx clear of any exudate or lesions. Normal dentition. Tympanic membrane is pearly white, no erythema or bulging. Neck: normal, supple, no masses, no thyromegaly Respiratory: clear to auscultation bilaterally, no wheezing, no crackles. Normal respiratory effort. No accessory muscle use.  Cardiovascular: Regular rate and rhythm, no murmurs / rubs / gallops. No extremity edema. 2+ pedal pulses. No carotid bruits.  Abdomen: no tenderness, no masses palpated. No hepatosplenomegaly. Bowel sounds positive.  Musculoskeletal: no clubbing / cyanosis. No joint deformity upper and lower  extremities. Good ROM, no contractures. Normal muscle tone.  Skin: no rashes, lesions, ulcers. No induration Neurologic: CN 2-12 grossly intact. Sensation intact, DTR normal. Strength 5/5 in all  4.  Psychiatric: Normal judgment and insight. Alert and oriented x 3. Normal mood.    Impression and Plan:  Encounter for preventive health examination  - Plan: Comprehensive metabolic panel, Hemoglobin A1c, Lipid panel, TSH, Vitamin B12, VITAMIN D 25 Hydroxy (Vit-D Deficiency, Fractures) -Recommend routine eye and dental care. -Immunizations: Advised COVID vaccination -Healthy lifestyle discussed in detail. -Labs to be updated today. -Colon cancer screening: Commence age 51 -Breast cancer screening: Done last week, obtain records -Cervical cancer screening: Status post complete hysterectomy, follows with GYN -Lung cancer screening: Not applicable -Prostate cancer screening: Not applicable -DEXA: Not applicable  Iron deficiency anemia, unspecified iron deficiency anemia type  - Plan: CBC with Differential/Platelet  Obesity (BMI 35.0-39.9 without comorbidity) -Discussed healthy lifestyle, including increased physical activity and better food choices to promote weight loss.    Patient Instructions  -Nice seeing you today!!  -Lab work today; will notify you once results are available.  -Remember your COVID vaccine.  -See you back in 1 year or sooner as needed.      Lelon Frohlich, MD [facility] at [facility]

## 2021-10-10 NOTE — Patient Instructions (Signed)
-  Nice seeing you today!!  -Lab work today; will notify you once results are available.  -Remember your COVID vaccine.  -See you back in 1 year or sooner as needed.

## 2021-10-22 ENCOUNTER — Ambulatory Visit: Payer: Commercial Managed Care - PPO | Admitting: Adult Health

## 2021-10-22 ENCOUNTER — Encounter: Payer: Self-pay | Admitting: Adult Health

## 2021-10-22 VITALS — BP 100/70 | HR 78 | Temp 99.0°F | Wt 210.0 lb

## 2021-10-22 DIAGNOSIS — R051 Acute cough: Secondary | ICD-10-CM

## 2021-10-22 MED ORDER — BENZONATATE 200 MG PO CAPS: 200.0000 mg | ORAL_CAPSULE | Freq: Three times a day (TID) | ORAL | 1 refills | Status: DC | PRN

## 2021-10-22 NOTE — Progress Notes (Signed)
Subjective:    Patient ID: Cheyenne Keller, female    DOB: March 21, 1980, 41 y.o.   MRN: [id number]  HPI 41 year old female who  has a past medical history of Fibroid, Iron deficiency anemia, PCOS (polycystic ovarian syndrome), and Pelvic adhesions.  She presents to the office today for an acute issue. She reports having a cough x 2-3 weeks. Her cough is productive with clear sputum. When the cough started she was producing green/yellow phlegm.   She denies fevers, chills, sinus pain, pressure, shortness of breath or wheezing  Worse with laying down and standing up.   She has not been using anything OTC for relief.    Review of Systems See HPI  Past Medical History:  Diagnosis Date   Fibroid    Iron deficiency anemia    PCOS (polycystic ovarian syndrome)    Pelvic adhesions     Social History   Socioeconomic History   Marital status: Married    Spouse name: Not on file   Number of children: Not on file   Years of education: Not on file   Highest education level: Not on file  Occupational History   Not on file  Tobacco Use   Smoking status: Never   Smokeless tobacco: Never  Vaping Use   Vaping Use: Never used  Substance and Sexual Activity   Alcohol use: Not Currently   Drug use: Never   Sexual activity: Not on file  Other Topics Concern   Not on file  Social History Narrative   Not on file   Social Determinants of Health   Financial Resource Strain: Low Risk  (09/17/2018)   Overall Financial Resource Strain (CARDIA)    Difficulty of Paying Living Expenses: Not hard at all  Food Insecurity: No Food Insecurity (09/17/2018)   Hunger Vital Sign    Worried About Running Out of Food in the Last Year: Never true    [place] in the Last Year: Never true  Transportation Needs: Unknown (09/17/2018)   PRAPARE - Hydrologist (Medical): No    Lack of Transportation (Non-Medical): Not on file  Physical Activity: Not on file  Stress:  Stress Concern Present (09/17/2018)   [place]    Feeling of Stress : To some extent  Social Connections: Not on file  Intimate Partner Violence: Not At Risk (09/17/2018)   Humiliation, Afraid, Rape, and Kick questionnaire    Fear of Current or Ex-Partner: No    Emotionally Abused: No    Physically Abused: No    Sexually Abused: No    Past Surgical History:  Procedure Laterality Date   CESAREAN SECTION N/A 09/28/2018   Procedure: Primary CESAREAN SECTION;  Surgeon: Servando Salina, MD;  Location: [facility] LD ORS;  Service: Obstetrics;  Laterality: N/A;  EDD: 10/19/18   MYOMECTOMY      Family History  Problem Relation Age of Onset   Stroke Mother    Heart disease Father    Kidney disease Father     Allergies  Allergen Reactions   Cinnamon     Tongue rash    Current Outpatient Medications on File Prior to Visit  Medication Sig Dispense Refill   Prenatal Vit-Fe Fumarate-FA (MULTIVITAMIN-PRENATAL) 27-0.8 MG TABS tablet Take 1 tablet by mouth at bedtime.      Vitamin D, Ergocalciferol, (DRISDOL) 1.25 MG (50000 UNIT) CAPS capsule Take 1 capsule (50,000 Units total) by mouth  every 7 (seven) days for 12 doses. 12 capsule 0   No current facility-administered medications on file prior to visit.    BP 100/70   Pulse 78   Temp 99 F (37.2 C) (Oral)   Wt 210 lb (95.3 kg)   LMP 12/19/2017 (Approximate)   SpO2 99%   BMI 38.41 kg/m       Objective:   Physical Exam Vitals and nursing note reviewed.  Constitutional:      Appearance: Normal appearance.  HENT:     Nose: No congestion or rhinorrhea.     Mouth/Throat:     Mouth: Mucous membranes are moist.     Pharynx: Oropharyngeal exudate present.     Comments: Clear PND  Cardiovascular:     Rate and Rhythm: Normal rate and regular rhythm.     Pulses: Normal pulses.     Heart sounds: Normal heart sounds.  Pulmonary:     Effort: Pulmonary effort is normal.      Breath sounds: Normal breath sounds.  Musculoskeletal:        General: Normal range of motion.  Skin:    General: Skin is warm and dry.     Capillary Refill: Capillary refill takes less than 2 seconds.  Neurological:     General: No focal deficit present.     Mental Status: She is alert and oriented to person, place, and time.  Psychiatric:        Mood and Affect: Mood normal.        Behavior: Behavior normal.        Thought Content: Thought content normal.        Judgment: Judgment normal.       Assessment & Plan:  1. Acute cough - Appears to be viral or origin .May be do to PND as well. Will send in Tessalon and advised either Astelin nasal spray or Flonase.  - benzonatate (TESSALON) 200 MG capsule; Take 1 capsule (200 mg total) by mouth 3 (three) times daily as needed for cough.  Dispense: 20 capsule; Refill: 1 - Follow up as needed  Dorothyann Peng, NP

## 2022-01-01 ENCOUNTER — Other Ambulatory Visit: Payer: Self-pay | Admitting: Internal Medicine

## 2022-01-01 DIAGNOSIS — E559 Vitamin D deficiency, unspecified: Secondary | ICD-10-CM

## 2022-01-09 ENCOUNTER — Telehealth: Payer: Self-pay | Admitting: Internal Medicine

## 2022-01-09 NOTE — Telephone Encounter (Signed)
Patient dropped off paperwork needing to be completed by someone for insurance enrollment regarding vitals from recent CPE. Patient is needing by Monday as it is due Tuesday. Would like to pick up in person. Patient was made aware PCP is out of office and of normal turn around time.        Please advise

## 2022-01-13 ENCOUNTER — Other Ambulatory Visit (INDEPENDENT_AMBULATORY_CARE_PROVIDER_SITE_OTHER): Payer: Commercial Managed Care - PPO

## 2022-01-13 DIAGNOSIS — E559 Vitamin D deficiency, unspecified: Secondary | ICD-10-CM

## 2022-01-13 DIAGNOSIS — D509 Iron deficiency anemia, unspecified: Secondary | ICD-10-CM | POA: Diagnosis not present

## 2022-01-13 LAB — CBC WITH DIFFERENTIAL/PLATELET
Basophils Absolute: 0 10*3/uL (ref 0.0–0.1)
Basophils Relative: 0.3 % (ref 0.0–3.0)
Eosinophils Absolute: 0.1 10*3/uL (ref 0.0–0.7)
Eosinophils Relative: 1.8 % (ref 0.0–5.0)
HCT: 33.9 % — ABNORMAL LOW (ref 36.0–46.0)
Hemoglobin: 11.1 g/dL — ABNORMAL LOW (ref 12.0–15.0)
Lymphocytes Relative: 38.1 % (ref 12.0–46.0)
Lymphs Abs: 2.4 10*3/uL (ref 0.7–4.0)
MCHC: 32.7 g/dL (ref 30.0–36.0)
MCV: 89.2 fl (ref 78.0–100.0)
Monocytes Absolute: 0.4 10*3/uL (ref 0.1–1.0)
Monocytes Relative: 7.2 % (ref 3.0–12.0)
Neutro Abs: 3.3 10*3/uL (ref 1.4–7.7)
Neutrophils Relative %: 52.6 % (ref 43.0–77.0)
Platelets: 193 10*3/uL (ref 150.0–400.0)
RBC: 3.8 Mil/uL — ABNORMAL LOW (ref 3.87–5.11)
RDW: 13.3 % (ref 11.5–15.5)
WBC: 6.3 10*3/uL (ref 4.0–10.5)

## 2022-01-13 LAB — VITAMIN D 25 HYDROXY (VIT D DEFICIENCY, FRACTURES): VITD: 45.44 ng/mL (ref 30.00–100.00)

## 2022-01-13 NOTE — Telephone Encounter (Signed)
Placed in Dr Hernandez's folder 

## 2022-01-24 ENCOUNTER — Ambulatory Visit (INDEPENDENT_AMBULATORY_CARE_PROVIDER_SITE_OTHER): Payer: Commercial Managed Care - PPO

## 2022-01-24 ENCOUNTER — Ambulatory Visit: Payer: Commercial Managed Care - PPO | Admitting: Podiatry

## 2022-01-24 DIAGNOSIS — M778 Other enthesopathies, not elsewhere classified: Secondary | ICD-10-CM

## 2022-01-24 DIAGNOSIS — Q6671 Congenital pes cavus, right foot: Secondary | ICD-10-CM | POA: Diagnosis not present

## 2022-01-24 DIAGNOSIS — Q667 Congenital pes cavus, unspecified foot: Secondary | ICD-10-CM

## 2022-01-24 DIAGNOSIS — M722 Plantar fascial fibromatosis: Secondary | ICD-10-CM

## 2022-01-24 NOTE — Progress Notes (Signed)
Subjective:  Patient ID: Cheyenne Keller, female    DOB: 05/01/1980,  MRN: [id number]  Chief Complaint  Patient presents with   Foot Pain    Bilateral foot pain.. pain on the top of foot and side     41 y.o. female presents with the above complaint.  Patient presents with bilateral foot pain to the dorsal midfoot with underlying arthritis.  Patient states pain for touch is progressive gotten worse the left side is worse than right side the right is not too bad.  Pain scale is 5 out of 10 dull aching nature she has not seen anyone else prior to seeing me.  She also does not wear any Orthotics.  She denies any other acute complaints.  She would like to discuss treatment options for this.   Review of Systems: Negative except as noted in the HPI. Denies N/V/F/Ch.  Past Medical History:  Diagnosis Date   Fibroid    Iron deficiency anemia    PCOS (polycystic ovarian syndrome)    Pelvic adhesions     Current Outpatient Medications:    benzonatate (TESSALON) 200 MG capsule, Take 1 capsule (200 mg total) by mouth 3 (three) times daily as needed for cough., Disp: 20 capsule, Rfl: 1   Prenatal Vit-Fe Fumarate-FA (MULTIVITAMIN-PRENATAL) 27-0.8 MG TABS tablet, Take 1 tablet by mouth at bedtime. , Disp: , Rfl:   Social History   Tobacco Use  Smoking Status Never  Smokeless Tobacco Never    Allergies  Allergen Reactions   Cinnamon     Tongue rash   Objective:  There were no vitals filed for this visit. There is no height or weight on file to calculate BMI. Constitutional Well developed. Well nourished.  Vascular Dorsalis pedis pulses palpable bilaterally. Posterior tibial pulses palpable bilaterally. Capillary refill normal to all digits.  No cyanosis or clubbing noted. Pedal hair growth normal.  Neurologic Normal speech. Oriented to person, place, and time. Epicritic sensation to light touch grossly present bilaterally.  Dermatologic Nails well groomed and normal in  appearance. No open wounds. No skin lesions.  Orthopedic: Pain on palpation to the left dorsal midfoot.  Pain with range of motion of the tarsometatarsal joints and the Lisfranc pain noted Lisfranc interval is intact.  No extensor tendinitis noted.  Findings consistent with both side but left side much greater than right side  Cavus foot type noted   Radiographs: 3 views of skeletally mature adult bilateral foot: There is mild midfoot arthritic changes noted pes cavus foot type noted.  Mild spurring noted no other bony abnormalities identified. Assessment:   1. Capsulitis of left foot   2. Pes cavus    Plan:  Patient was evaluated and treated and all questions answered.  Bilateral midfoot capsulitis left greater than right side -All questions and concerns were discussed with the patient in clinical detail given the amount of pain that she is having she will benefit from a steroid injection especially to the left side.  Patient agrees with plan like to proceed with steroid injection -A steroid injection was performed at left dorsal midfoot using 1% plain Lidocaine and 10 mg of Kenalog. This was well tolerated.  Pes planovalgus -I explained to patient the etiology of pes cavus and relationship with arch/midfoot and various treatment options were discussed.  Given patient foot structure in the setting of arch/midfoot I believe patient will benefit from custom-made orthotics to help control the hindfoot motion support the arch of the foot and take the  stress away from plantar fascial.  Patient agrees with the plan like to proceed with orthotics -Patient was casted for orthotics    No follow-ups on file.

## 2022-02-26 ENCOUNTER — Other Ambulatory Visit: Payer: Commercial Managed Care - PPO

## 2022-02-26 ENCOUNTER — Ambulatory Visit (INDEPENDENT_AMBULATORY_CARE_PROVIDER_SITE_OTHER): Payer: Commercial Managed Care - PPO

## 2022-02-26 DIAGNOSIS — Q667 Congenital pes cavus, unspecified foot: Secondary | ICD-10-CM

## 2022-02-26 NOTE — Progress Notes (Signed)
Patient presents today to pick up custom molded foot orthotics, diagnosed with Pes Cavus by Dr. Posey Pronto.   Orthotics were dispensed and fit was satisfactory. Reviewed instructions for break-in and wear. Written instructions given to patient.  Patient will follow up as needed.   [facility] - order # [id number]

## 2022-03-07 ENCOUNTER — Ambulatory Visit: Payer: Commercial Managed Care - PPO | Admitting: Podiatry

## 2022-03-07 DIAGNOSIS — M778 Other enthesopathies, not elsewhere classified: Secondary | ICD-10-CM | POA: Diagnosis not present

## 2022-03-07 DIAGNOSIS — Q667 Congenital pes cavus, unspecified foot: Secondary | ICD-10-CM

## 2022-03-07 NOTE — Progress Notes (Signed)
  Subjective:  Patient ID: Cheyenne Keller, female    DOB: April 05, 1980,  MRN: [id number]  Chief Complaint  Patient presents with   Foot Pain    Bilateral foot pain.. pain on the top of foot and side     41 y.o. female presents with the above complaint.  Patient presents with bilateral dorsal midfoot pain.  Patient states she is doing a lot better.  Denies any other acute complaints.  Orthotics are functioning well.  The injection helped considerably   Review of Systems: Negative except as noted in the HPI. Denies N/V/F/Ch.  Past Medical History:  Diagnosis Date   Fibroid    Iron deficiency anemia    PCOS (polycystic ovarian syndrome)    Pelvic adhesions     Current Outpatient Medications:    benzonatate (TESSALON) 200 MG capsule, Take 1 capsule (200 mg total) by mouth 3 (three) times daily as needed for cough., Disp: 20 capsule, Rfl: 1   Prenatal Vit-Fe Fumarate-FA (MULTIVITAMIN-PRENATAL) 27-0.8 MG TABS tablet, Take 1 tablet by mouth at bedtime. , Disp: , Rfl:   Social History   Tobacco Use  Smoking Status Never  Smokeless Tobacco Never    Allergies  Allergen Reactions   Cinnamon     Tongue rash   Objective:  There were no vitals filed for this visit. There is no height or weight on file to calculate BMI. Constitutional Well developed. Well nourished.  Vascular Dorsalis pedis pulses palpable bilaterally. Posterior tibial pulses palpable bilaterally. Capillary refill normal to all digits.  No cyanosis or clubbing noted. Pedal hair growth normal.  Neurologic Normal speech. Oriented to person, place, and time. Epicritic sensation to light touch grossly present bilaterally.  Dermatologic Nails well groomed and normal in appearance. No open wounds. No skin lesions.  Orthopedic: No further pain on palpation to the left dorsal midfoot.  No further pain with range of motion of the tarsometatarsal joints and the Lisfranc no further pain noted Lisfranc interval is  intact.  No extensor tendinitis noted.  Findings consistent with both side but left side much greater than right side  Cavus foot type noted   Radiographs: 3 views of skeletally mature adult bilateral foot: There is mild midfoot arthritic changes noted pes cavus foot type noted.  Mild spurring noted no other bony abnormalities identified. Assessment:   1. Capsulitis of left foot   2. Pes cavus    Plan:  Patient was evaluated and treated and all questions answered.  Bilateral midfoot capsulitis left greater than right side -Clinically healed no further injection indicated at this time.  I discussed with her shoe gear modification if any foot and ankle issues on future she will come back and see me.  Pes planovalgus -I explained to patient the etiology of pes cavus and relationship with arch/midfoot and various treatment options were discussed.  Given patient foot structure in the setting of arch/midfoot I believe patient will benefit from custom-made orthotics to help control the hindfoot motion support the arch of the foot and take the stress away from plantar fascial.  Patient agrees with the plan like to proceed with orthotics -Patient's doing well in the orthotics.    No follow-ups on file.

## 2022-04-14 ENCOUNTER — Other Ambulatory Visit: Payer: Commercial Managed Care - PPO

## 2022-04-18 ENCOUNTER — Other Ambulatory Visit (INDEPENDENT_AMBULATORY_CARE_PROVIDER_SITE_OTHER): Payer: Commercial Managed Care - PPO

## 2022-04-18 DIAGNOSIS — E785 Hyperlipidemia, unspecified: Secondary | ICD-10-CM

## 2022-04-18 LAB — LIPID PANEL
Cholesterol: 210 mg/dL — ABNORMAL HIGH (ref 0–200)
HDL: 57.1 mg/dL (ref >39.00)
LDL Cholesterol: 141 mg/dL — ABNORMAL HIGH (ref 0–99)
NonHDL: 153.05
Total CHOL/HDL Ratio: 4
Triglycerides: 58 mg/dL (ref 0.0–149.0)
VLDL: 11.6 mg/dL (ref 0.0–40.0)

## 2022-04-22 ENCOUNTER — Other Ambulatory Visit: Payer: Self-pay | Admitting: Internal Medicine

## 2022-04-22 DIAGNOSIS — E782 Mixed hyperlipidemia: Secondary | ICD-10-CM

## 2022-04-22 MED ORDER — ATORVASTATIN CALCIUM 20 MG PO TABS: 20.0000 mg | ORAL_TABLET | Freq: Every day | ORAL | 1 refills | Status: DC

## 2022-05-21 ENCOUNTER — Other Ambulatory Visit: Payer: Self-pay | Admitting: Nurse Practitioner

## 2022-05-21 ENCOUNTER — Ambulatory Visit: Payer: Commercial Managed Care - PPO | Admitting: Nurse Practitioner

## 2022-05-21 ENCOUNTER — Encounter: Payer: Self-pay | Admitting: Nurse Practitioner

## 2022-05-21 VITALS — BP 119/80 | HR 77 | Temp 98.0°F | Resp 16 | Ht 62.0 in | Wt 218.4 lb

## 2022-05-21 DIAGNOSIS — R1013 Epigastric pain: Secondary | ICD-10-CM

## 2022-05-21 MED ORDER — PANTOPRAZOLE SODIUM 40 MG PO TBEC: 40.0000 mg | DELAYED_RELEASE_TABLET | Freq: Every day | ORAL | 0 refills | Status: DC

## 2022-05-21 NOTE — Progress Notes (Signed)
Established Patient Visit  Patient: Cheyenne Keller   DOB: 08-30-1980   42 y.o. Female  MRN: [id number] Visit Date: 05/21/2022  Subjective:    Chief Complaint  Patient presents with   Abdominal Pain    Upper gastric pain  Nausea and vomiting.  Tried eating and pepto bismol no relief.  This started 3 weeks ago.    Abdominal Pain This is a new problem. The current episode started 1 to 4 weeks ago. The onset quality is gradual. The problem occurs constantly. The problem has been waxing and waning. The pain is located in the epigastric region. The pain is severe. The quality of the pain is a sensation of fullness, dull and colicky. The abdominal pain radiates to the back. Associated symptoms include anorexia, nausea and vomiting. Pertinent negatives include no belching, constipation, diarrhea, fever, frequency, hematochezia, hematuria, melena, myalgias or weight loss.  Used prilosec used x 14days, minimal improvement. Onset with intermittent fasting 2weeks ago, got worse with consumption of coffee, chocolate and wine. S/p hysterectomy, ovaries present. Hx of GERD with intermittent flares, use of omeprazole '20mg'$  prn  Reviewed medical, surgical, and social history today  Medications: Outpatient Medications Prior to Visit  Medication Sig   atorvastatin (LIPITOR) 20 MG tablet Take 1 tablet (20 mg total) by mouth daily.   Prenatal Vit-Fe Fumarate-FA (MULTIVITAMIN-PRENATAL) 27-0.8 MG TABS tablet Take 1 tablet by mouth at bedtime.    benzonatate (TESSALON) 200 MG capsule Take 1 capsule (200 mg total) by mouth 3 (three) times daily as needed for cough. (Patient not taking: Reported on 05/21/2022)   No facility-administered medications prior to visit.   Reviewed past medical and social history.   ROS per HPI above      Objective:  BP 119/80 (BP Location: Left Arm, Patient Position: Sitting, Cuff Size: Large)   Pulse 77   Temp 98 F (36.7 C) (Temporal)   Resp 16   Ht 5'  2" (1.575 m)   Wt 218 lb 6.4 oz (99.1 kg)   LMP 12/19/2017 (Approximate)   SpO2 98%   BMI 39.95 kg/m      Physical Exam Vitals reviewed.  Constitutional:      General: She is not in acute distress. Cardiovascular:     Rate and Rhythm: Normal rate.  Pulmonary:     Effort: Pulmonary effort is normal.  Abdominal:     General: Bowel sounds are normal.     Tenderness: There is abdominal tenderness in the epigastric area. There is no guarding. Negative signs include Murphy's sign and McBurney's sign.  Neurological:     Mental Status: She is alert.     No results found for any visits on 05/21/22.    Assessment & Plan:    Problem List Items Addressed This Visit   None Visit Diagnoses     Dyspepsia    -  Primary   Relevant Medications   pantoprazole (PROTONIX) 40 MG tablet   Other Relevant Orders   CBC   Hepatic function panel   Lipase   US Abdomen Limited RUQ (LIVER/GB)   Helicobacter Pylori Special Antigen, Stool   Epigastric pain       Relevant Medications   pantoprazole (PROTONIX) 40 MG tablet   Other Relevant Orders   CBC   Hepatic function panel   Lipase   US Abdomen Limited RUQ (LIVER/GB)   Helicobacter Pylori Special Antigen, Stool  Peptic ulcer vs H.pylori gastritis vs cholecystitis? Return stool to lab once collected. Stop omeprazole Start pantoprazole Avoid trigger foods You will be contacted to schedule appt for ABD [place] F/up with PCP if no improvement in 1week  Return if symptoms worsen or fail to improve.     Wilfred Lacy, NP

## 2022-05-21 NOTE — Patient Instructions (Addendum)
Go to lab  Gastritis, Adult Gastritis is inflammation of the stomach. There are two kinds of gastritis: Acute gastritis. This kind develops suddenly. Chronic gastritis. This kind is much more common. It develops slowly and lasts for a long time. Gastritis happens when the lining of the stomach becomes weak or gets damaged. Without treatment, gastritis can lead to stomach bleeding and ulcers. What are the causes? This condition may be caused by: An infection. Drinking too much alcohol. Certain medicines. These include steroids, antibiotics, and some over-the-counter medicines, such as aspirin or ibuprofen. Having too much acid in the stomach. Having a disease of the stomach. Other causes may include: An allergic reaction. Some cancer treatments (radiation). Smoking cigarettes or the use of products that contain nicotine or tobacco. In some cases, the cause of this condition is not known. What increases the risk? Having a disease of the intestines. Having a disease in which the body's immune system attacks the body (autoimmune disease), such as Crohn's disease. Using aspirin or ibuprofen and other NSAIDs to treat other conditions, such as heart disease or chronic pain. Stress. What are the signs or symptoms? Symptoms of this condition include: Pain or a burning sensation in the upper abdomen. Nausea. Vomiting. An uncomfortable feeling of fullness after eating. Weight loss. Bad breath. Blood in your vomit or stool (feces). In some cases, there are no symptoms. How is this diagnosed? This condition may be diagnosed based on your medical history, a physical exam, and tests. Tests may include: Your medical history and a description of your symptoms. A physical exam. Tests. These can include: Blood tests. Stool tests. A test in which a thin, flexible instrument with a light and a camera is passed down the esophagus and into the stomach (upper endoscopy). A test in which a tissue  sample is removed to look at it under a microscope (biopsy). How is this treated? This condition may be treated with medicines. The medicines that are used vary depending on the cause of the gastritis. If the condition is caused by a bacterial infection, you may be given antibiotic medicines. If the condition is caused by too much acid in the stomach, you may be given medicines called H2 blockers, proton pump inhibitors, or antacids. Treatment may also involve stopping the use of certain medicines such as aspirin or ibuprofen and other NSAIDs. Follow these instructions at home: Medicines Take over-the-counter and prescription medicines only as told by your health care provider. If you were prescribed an antibiotic medicine, take it as told by your health care provider. Do not stop taking the antibiotic even if you start to feel better. Alcohol use Do not drink alcohol if: Your health care provider tells you not to drink. You are pregnant, may be pregnant, or are planning to become pregnant. If you drink alcohol: Limit your use to: 0-1 drink a day for women. 0-2 drinks a day for men. Know how much alcohol is in your drink. In the [place] one drink equals one 12 oz bottle of beer (355 mL), one 5 oz glass of wine (148 mL), or one 1 oz glass of hard liquor (44 mL). General instructions  Eat small, frequent meals instead of large meals. Avoid foods and drinks that make your symptoms worse. Talk with your health care provider about ways to manage stress, such as getting regular exercise or practicing deep breathing, meditation, or yoga. Do not use any products that contain nicotine or tobacco. These products include cigarettes, chewing tobacco, and vaping  devices, such as e-cigarettes. If you need help quitting, ask your health care provider. Drink enough fluid to keep your urine pale yellow. Keep all follow-up visits. This is important. Contact a health care provider if: Your symptoms get  worse. Your abdominal pain gets worse. Your symptoms return after treatment. You have a fever. Get help right away if: You vomit blood or a substance that looks like coffee grounds. You have black or dark red stools. You are unable to keep fluids down. These symptoms may represent a serious problem that is an emergency. Do not wait to see if the symptoms will go away. Get medical help right away. Call your local emergency services (911 in the [place] Do not drive yourself to the hospital. Summary Gastritis is inflammation of the lining of the stomach that can occur suddenly (acute) or develop slowly over time (chronic). This condition is diagnosed with a medical history, a physical exam, or tests. This condition may be treated with medicines to treat infection or medicines to reduce the amount of acid in your stomach. Follow your health care provider's instructions about taking medicines, making changes to your diet, and knowing when to call for help. This information is not intended to replace advice given to you by your health care provider. Make sure you discuss any questions you have with your health care provider. Document Revised: 07/07/2020 Document Reviewed: 07/07/2020 Elsevier Patient Education  [place].

## 2022-05-22 LAB — HEPATIC FUNCTION PANEL
ALT: 12 U/L (ref 0–35)
AST: 12 U/L (ref 0–37)
Albumin: 3.7 g/dL (ref 3.5–5.2)
Alkaline Phosphatase: 78 U/L (ref 39–117)
Bilirubin, Direct: 0.1 mg/dL (ref 0.0–0.3)
Total Bilirubin: 0.4 mg/dL (ref 0.2–1.2)
Total Protein: 7.1 g/dL (ref 6.0–8.3)

## 2022-05-22 LAB — CBC
HCT: 33.8 % — ABNORMAL LOW (ref 36.0–46.0)
Hemoglobin: 11.2 g/dL — ABNORMAL LOW (ref 12.0–15.0)
MCHC: 33.2 g/dL (ref 30.0–36.0)
MCV: 88.9 fl (ref 78.0–100.0)
Platelets: 235 10*3/uL (ref 150.0–400.0)
RBC: 3.8 Mil/uL — ABNORMAL LOW (ref 3.87–5.11)
RDW: 13.3 % (ref 11.5–15.5)
WBC: 7.6 10*3/uL (ref 4.0–10.5)

## 2022-05-22 LAB — LIPASE: Lipase: 14 U/L (ref 11.0–59.0)

## 2022-05-22 NOTE — Progress Notes (Signed)
Stable Follow instructions as discussed during office visit.

## 2022-05-27 LAB — HELICOBACTER PYLORI  SPECIAL ANTIGEN
MICRO NUMBER:: 14668529
SPECIMEN QUALITY: ADEQUATE

## 2022-05-27 NOTE — Progress Notes (Signed)
Stable Follow instructions as discussed during office visit.

## 2022-06-08 ENCOUNTER — Encounter: Payer: Self-pay | Admitting: Internal Medicine

## 2022-06-18 ENCOUNTER — Encounter: Payer: Self-pay | Admitting: Internal Medicine

## 2022-06-18 ENCOUNTER — Ambulatory Visit (INDEPENDENT_AMBULATORY_CARE_PROVIDER_SITE_OTHER): Payer: Commercial Managed Care - PPO | Admitting: Internal Medicine

## 2022-06-18 NOTE — Progress Notes (Signed)
Established Patient Office Visit     CC/Reason for Visit: Discuss obesity  HPI: Cheyenne Keller is a 42 y.o. female who is coming in today for the above mentioned reasons. Past Medical History is significant for: Morbid obesity, hyperlipidemia, impaired glucose tolerance.  She is here today to discuss obesity.  She believes she stress eats.  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  She tried intermittent fasting recently and developed peptic ulcer disease.   Past Medical/Surgical History: Past Medical History:  Diagnosis Date   Fibroid    Iron deficiency anemia    PCOS (polycystic ovarian syndrome)    Pelvic adhesions     Past Surgical History:  Procedure Laterality Date   CESAREAN SECTION N/A 09/28/2018   Procedure: Primary CESAREAN SECTION;  Surgeon: Servando Salina, MD;  Location: [facility] LD ORS;  Service: Obstetrics;  Laterality: N/A;  EDD: 10/19/18   MYOMECTOMY      Social History:  reports that she has never smoked. She has never used smokeless tobacco. She reports that she does not currently use alcohol. She reports that she does not use drugs.  Allergies: Allergies  Allergen Reactions   Cinnamon     Tongue rash    Family History:  Family History  Problem Relation Age of Onset   Stroke Mother    Heart disease Father    Kidney disease Father      Current Outpatient Medications:    atorvastatin (LIPITOR) 20 MG tablet, Take 1 tablet (20 mg total) by mouth daily., Disp: 90 tablet, Rfl: 1   pantoprazole (PROTONIX) 40 MG tablet, Take 1 tablet (40 mg total) by mouth daily. Before breakfast, Disp: 30 tablet, Rfl: 0   Prenatal Vit-Fe Fumarate-FA (MULTIVITAMIN-PRENATAL) 27-0.8 MG TABS tablet, Take 1 tablet by mouth at bedtime. , Disp: , Rfl:   Review of Systems:  Negative unless indicated in HPI.   Physical Exam: Vitals:   06/18/22 1606  BP: 120/70  Pulse: (!) 110  Temp: 98.4 F (36.9 C)  TempSrc: Oral  SpO2: 99%  Weight:  220 lb 3.2 oz (99.9 kg)    Body mass index is 40.28 kg/m.   Physical Exam Vitals reviewed.  Constitutional:      Appearance: Normal appearance.  HENT:     Head: Normocephalic and atraumatic.  Eyes:     Conjunctiva/sclera: Conjunctivae normal.     Pupils: Pupils are equal, round, and reactive to light.  Skin:    General: Skin is warm and dry.  Neurological:     General: No focal deficit present.     Mental Status: She is alert and oriented to person, place, and time.  Psychiatric:        Mood and Affect: Mood normal.        Behavior: Behavior normal.        Thought Content: Thought content normal.        Judgment: Judgment normal.      Impression and Plan:  Morbid obesity - Plan: Amb Ref to Medical Weight Management  -Discussed healthy lifestyle, including increased physical activity and better food choices to promote weight loss. -We have discussed importance of meal planning. -Referral to medical weight management has been placed. -She will also call her insurance to see if they have a prescription weight loss benefit.  She would be an excellent GLP-1 candidate.   Time spent:30 minutes reviewing chart, interviewing and examining patient and formulating plan of care.  Lelon Frohlich, MD [facility]

## 2022-06-19 ENCOUNTER — Other Ambulatory Visit: Payer: Self-pay | Admitting: Internal Medicine

## 2022-06-19 MED ORDER — TIRZEPATIDE-WEIGHT MANAGEMENT 2.5 MG/0.5ML ~~LOC~~ SOAJ: 2.5000 mg | SUBCUTANEOUS | 0 refills | Status: DC

## 2022-06-20 ENCOUNTER — Ambulatory Visit
Admission: RE | Admit: 2022-06-20 | Discharge: 2022-06-20 | Disposition: A | Discharge status: Home / Self Care | Payer: Commercial Managed Care - PPO | Source: Ambulatory Visit | Attending: Nurse Practitioner | Admitting: Nurse Practitioner

## 2022-06-20 DIAGNOSIS — R1013 Epigastric pain: Secondary | ICD-10-CM

## 2022-06-23 MED ORDER — WEGOVY 0.25 MG/0.5ML ~~LOC~~ SOAJ: 0.2500 mg | SUBCUTANEOUS | 2 refills | Status: DC

## 2022-06-23 NOTE — Addendum Note (Signed)
Addended by: Kern Reap B on: 06/23/2022 09:14 AM   Modules accepted: Orders

## 2022-07-16 ENCOUNTER — Encounter (INDEPENDENT_AMBULATORY_CARE_PROVIDER_SITE_OTHER): Payer: Commercial Managed Care - PPO | Admitting: Family Medicine

## 2022-07-16 DIAGNOSIS — Z0289 Encounter for other administrative examinations: Secondary | ICD-10-CM

## 2022-07-24 ENCOUNTER — Telehealth: Payer: Self-pay

## 2022-07-24 NOTE — Telephone Encounter (Signed)
PA initiated via Covermymeds; KEY: [id number]. Awaiting determination

## 2022-07-24 NOTE — Telephone Encounter (Signed)
PA approved.   Message from plan: Request Reference Number: [id number]. WEGOVY INJ 0.25MG  is approved through 02/23/2023. Your patient may now fill this prescription and it will be covered.. Authorization Expiration Date: February 23, 2023

## 2022-08-04 ENCOUNTER — Encounter: Payer: Self-pay | Admitting: Nurse Practitioner

## 2022-08-04 ENCOUNTER — Other Ambulatory Visit: Payer: Self-pay | Admitting: Internal Medicine

## 2022-08-04 DIAGNOSIS — R1013 Epigastric pain: Secondary | ICD-10-CM

## 2022-08-04 MED ORDER — PANTOPRAZOLE SODIUM 40 MG PO TBEC: 40.0000 mg | DELAYED_RELEASE_TABLET | Freq: Every day | ORAL | 1 refills | Status: DC

## 2022-08-19 ENCOUNTER — Ambulatory Visit (INDEPENDENT_AMBULATORY_CARE_PROVIDER_SITE_OTHER): Payer: Commercial Managed Care - PPO | Admitting: Family Medicine

## 2022-08-19 ENCOUNTER — Telehealth (INDEPENDENT_AMBULATORY_CARE_PROVIDER_SITE_OTHER): Payer: Self-pay | Admitting: Family Medicine

## 2022-08-19 NOTE — Telephone Encounter (Signed)
Attempted to reach the patient regarding late arrival for 8:40am appointment (patient requested to discuss but already left practice to take her child to school). Left messge to return call.

## 2022-08-20 ENCOUNTER — Ambulatory Visit (INDEPENDENT_AMBULATORY_CARE_PROVIDER_SITE_OTHER): Payer: Commercial Managed Care - PPO | Admitting: Family Medicine

## 2022-09-02 ENCOUNTER — Ambulatory Visit (INDEPENDENT_AMBULATORY_CARE_PROVIDER_SITE_OTHER): Payer: Commercial Managed Care - PPO | Admitting: Family Medicine

## 2022-09-02 ENCOUNTER — Encounter: Payer: Self-pay | Admitting: Internal Medicine

## 2022-09-03 ENCOUNTER — Other Ambulatory Visit: Payer: Self-pay | Admitting: Internal Medicine

## 2022-09-03 DIAGNOSIS — F40243 Fear of flying: Secondary | ICD-10-CM

## 2022-09-03 MED ORDER — LORAZEPAM 0.5 MG PO TABS
0.5000 mg | ORAL_TABLET | Freq: Every day | ORAL | 0 refills | Status: DC | PRN
Start: 2022-09-03 — End: 2023-01-08

## 2022-09-03 NOTE — Telephone Encounter (Signed)
Pt called to F/U on this request, stating her flight leaves at 5 pm this evening.

## 2022-09-11 ENCOUNTER — Encounter: Payer: Self-pay | Admitting: Internal Medicine

## 2022-09-12 ENCOUNTER — Encounter: Payer: Self-pay | Admitting: Family Medicine

## 2022-09-12 ENCOUNTER — Telehealth (INDEPENDENT_AMBULATORY_CARE_PROVIDER_SITE_OTHER): Payer: Commercial Managed Care - PPO | Admitting: Family Medicine

## 2022-09-12 DIAGNOSIS — U071 COVID-19: Secondary | ICD-10-CM | POA: Diagnosis not present

## 2022-09-12 DIAGNOSIS — R519 Headache, unspecified: Secondary | ICD-10-CM

## 2022-09-12 DIAGNOSIS — R051 Acute cough: Secondary | ICD-10-CM

## 2022-09-12 MED ORDER — BENZONATATE 200 MG PO CAPS
200.0000 mg | ORAL_CAPSULE | Freq: Two times a day (BID) | ORAL | 0 refills | Status: DC | PRN
Start: 2022-09-12 — End: 2022-10-14

## 2022-09-12 NOTE — Progress Notes (Signed)
Virtual Visit via Video Note  I connected with Cheyenne Keller on 09/12/22 at  3:30 PM EDT by a video enabled telemedicine application and verified that I am speaking with the correct person using two identifiers.  Location patient: home Location provider:work or home office Persons participating in the virtual visit: patient, provider  I discussed the limitations of evaluation and management by telemedicine and the availability of in person appointments. The patient expressed understanding and agreed to proceed.  Chief Complaint  Patient presents with   Covid Positive    Tested Positive on Wednesday. Cough, stuffy runny nose, HA, hurts to cough, no body aches, mostly head and respiratory symptoms. Tylenol, Dayquil and Nyquil. Cough is sometimes productive. Was running a fever,but meds have brought it down      HPI: Pt is a 42 yo female followed by Dr. Ardyth Harps and seen for acute concern. Pt started feeling bad on Mon, 09/08/22.  COVID test was positive on 09/09/24.  Pt got sick after traveling from [facility].  A few of the other ppl she traveled with also became sick with COVID.   Pt with productive cough, rhinorrhea, nasal congestion, HA, sinus pressure, pressure behind eyes-hurts to move them.  Denies n/v, diarrhea.  Tried Nyquil, dayquil, and Tylenol for symptoms.  Staying hydrated.  Appetite is ok.  Requesting Tessalon.  Patient has had COVID vaccinations.  ROS: See pertinent positives and negatives per HPI.  Past Medical History:  Diagnosis Date   Fibroid    Iron deficiency anemia    PCOS (polycystic ovarian syndrome)    Pelvic adhesions     Past Surgical History:  Procedure Laterality Date   CESAREAN SECTION N/A 09/28/2018   Procedure: Primary CESAREAN SECTION;  Surgeon: Maxie Better, MD;  Location: [facility] LD ORS;  Service: Obstetrics;  Laterality: N/A;  EDD: 10/19/18   MYOMECTOMY      Family History  Problem Relation Age of Onset   Stroke Mother    Heart  disease Father    Kidney disease Father      Current Outpatient Medications:    atorvastatin (LIPITOR) 20 MG tablet, Take 1 tablet (20 mg total) by mouth daily., Disp: 90 tablet, Rfl: 1   LORazepam (ATIVAN) 0.5 MG tablet, Take 1 tablet (0.5 mg total) by mouth daily as needed for anxiety (For anxiety with flying)., Disp: 10 tablet, Rfl: 0   pantoprazole (PROTONIX) 40 MG tablet, Take 1 tablet (40 mg total) by mouth daily. Before breakfast, Disp: 90 tablet, Rfl: 1   Prenatal Vit-Fe Fumarate-FA (MULTIVITAMIN-PRENATAL) 27-0.8 MG TABS tablet, Take 1 tablet by mouth at bedtime. , Disp: , Rfl:    Semaglutide-Weight Management (WEGOVY) 0.25 MG/0.5ML SOAJ, Inject 0.25 mg into the skin once a week., Disp: 2 mL, Rfl: 2   tirzepatide (ZEPBOUND) 2.5 MG/0.5ML Pen, Inject 2.5 mg into the skin once a week., Disp: 2 mL, Rfl: 0  EXAM:  VITALS per patient if applicable: RR between 12-20 bpm.  GENERAL: alert, oriented, appears well and in no acute distress  HEENT: atraumatic, conjunctiva clear, no obvious abnormalities on inspection of external nose and ears  NECK: normal movements of the head and neck  LUNGS: Dry to mildly productive cough.  On inspection no signs of respiratory distress, breathing rate appears normal, no obvious gross SOB, gasping or wheezing  CV: no obvious cyanosis  MS: moves all visible extremities without noticeable abnormality  PSYCH/NEURO: pleasant and cooperative, no obvious depression or anxiety, speech and thought processing grossly intact  ASSESSMENT  AND PLAN:  Discussed the following assessment and plan:  COVID-19 virus infection - Plan: benzonatate (TESSALON) 200 MG capsule  Acute cough - Plan: benzonatate (TESSALON) 200 MG capsule  Headache due to viral infection  Acute URI symptoms starting 09/08/22 2/2 COVID-19 infection.  Positive test on 09/10/2022.  Discussed antiviral medication r/b/a.  Patient declines at this time.  Rx for Tessalon sent to pharmacy.  Continue  supportive care.  Given strict precautions.  Follow-up as needed.   I discussed the assessment and treatment plan with the patient. The patient was provided an opportunity to ask questions and all were answered. The patient agreed with the plan and demonstrated an understanding of the instructions.   The patient was advised to call back or seek an in-person evaluation if the symptoms worsen or if the condition fails to improve as anticipated.   Deeann Saint, MD

## 2022-09-22 ENCOUNTER — Encounter: Payer: Self-pay | Admitting: Internal Medicine

## 2022-09-30 ENCOUNTER — Other Ambulatory Visit (INDEPENDENT_AMBULATORY_CARE_PROVIDER_SITE_OTHER): Payer: Self-pay | Admitting: Family Medicine

## 2022-09-30 ENCOUNTER — Encounter (INDEPENDENT_AMBULATORY_CARE_PROVIDER_SITE_OTHER): Payer: Self-pay | Admitting: Family Medicine

## 2022-09-30 ENCOUNTER — Ambulatory Visit (INDEPENDENT_AMBULATORY_CARE_PROVIDER_SITE_OTHER): Payer: Commercial Managed Care - PPO | Admitting: Family Medicine

## 2022-09-30 VITALS — BP 96/62 | HR 79 | Temp 98.2°F | Ht 62.0 in | Wt 218.0 lb

## 2022-09-30 DIAGNOSIS — R5383 Other fatigue: Secondary | ICD-10-CM | POA: Diagnosis not present

## 2022-09-30 DIAGNOSIS — R7303 Prediabetes: Secondary | ICD-10-CM | POA: Diagnosis not present

## 2022-09-30 DIAGNOSIS — E782 Mixed hyperlipidemia: Secondary | ICD-10-CM

## 2022-09-30 DIAGNOSIS — K219 Gastro-esophageal reflux disease without esophagitis: Secondary | ICD-10-CM

## 2022-09-30 DIAGNOSIS — R0602 Shortness of breath: Secondary | ICD-10-CM

## 2022-09-30 DIAGNOSIS — E559 Vitamin D deficiency, unspecified: Secondary | ICD-10-CM | POA: Diagnosis not present

## 2022-09-30 DIAGNOSIS — E669 Obesity, unspecified: Secondary | ICD-10-CM

## 2022-09-30 DIAGNOSIS — D508 Other iron deficiency anemias: Secondary | ICD-10-CM

## 2022-09-30 DIAGNOSIS — Z6841 Body Mass Index (BMI) 40.0 and over, adult: Secondary | ICD-10-CM

## 2022-09-30 DIAGNOSIS — F439 Reaction to severe stress, unspecified: Secondary | ICD-10-CM

## 2022-09-30 DIAGNOSIS — Z1331 Encounter for screening for depression: Secondary | ICD-10-CM | POA: Diagnosis not present

## 2022-09-30 MED ORDER — SERTRALINE HCL 50 MG PO TABS: 50.0000 mg | ORAL_TABLET | Freq: Every day | ORAL | 0 refills | Status: DC

## 2022-09-30 NOTE — Progress Notes (Signed)
Chief Complaint:   OBESITY Cheyenne Keller (MR# [id number]) is a 42 y.o. female who presents for evaluation and treatment of obesity and related comorbidities. Current BMI is Body mass index is 39.87 kg/m. Cheyenne Keller has been struggling with her weight for many years and has been unsuccessful in either losing weight, maintaining weight loss, or reaching her healthy weight goal.  Cheyenne Keller is currently in the action stage of change and ready to dedicate time achieving and maintaining a healthier weight. Cheyenne Keller is interested in becoming our patient and working on intensive lifestyle modifications including (but not limited to) diet and exercise for weight loss.  Patient is referral from Dr. Ardyth Harps.  Previously tried many different meal plans and traditional "diets" in the past.  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  Works 40hrs a week from home.  Lives at home with her husband [name] (daughter) and Dortha Schwalbe (son).  She does strength training and walking almost daily.  Ultimate weight goal is 136lbs a day.  Previously got approved for [employer] but never picked up.  Occasionally skips breakfast.  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 May eat 2 pieces of sourdough bread with honey and jam or tea latte with oat milk and may add [employer] syrup.  Occasionally may fry an egg or two (feels full from this, satisfied from the other).  May start snacking immediately- small bag of chips.  Grazes throughout the day.  If she skips breakfast she will eat rice and stew that has meat in it for lunch.  Dinner could be spaghetti and meatballs, tacos, [place] food, peanut butter soup. 1.5 cups of spaghetti with 4 homemade meatballs and homemade sauce and some grated parmesan cheese with sriracha.  If she eats tacos will eat about 4.   Cheyenne Keller's habits were reviewed today and are as follows: Her family eats meals together, she thinks her  family will eat healthier with her, her desired weight loss is 82 lbs, she started gaining weight in her mid-late 20's, her heaviest weight ever was 230 pounds, she has significant food cravings issues, she snacks frequently in the evenings, she skips meals frequently, she is frequently drinking liquids with calories, she frequently makes poor food choices, she frequently eats larger portions than normal, and she struggles with emotional eating.  Depression Screen Cheyenne Keller's Food and Mood (modified PHQ-9) score was 9.  Subjective:   1. Other fatigue Kirra admits to daytime somnolence and denies waking up still tired. Patient has a history of symptoms of daytime fatigue. Marcela generally gets 4 or 6 hours of sleep per night, and states that she has generally restful sleep. Snoring is present. Apneic episodes are not present. Epworth Sleepiness Score is 14.  EKG-normal sinus rhythm at 81 bpm.  2. SOBOE (shortness of breath on exertion) Cheyenne Keller notes increasing shortness of breath with exercising and seems to be worsening over time with weight gain. She notes getting out of breath sooner with activity than she used to. This has not gotten worse recently. Cheyenne Keller denies shortness of breath at rest or orthopnea.  3. Vitamin D deficiency Patient's previous vitamin D level was low.  She is on OTC vitamin D 2000 IU daily.  4. Mixed hyperlipidemia Patient is on atorvastatin 20 mg once daily with no side effects noted.  5. Gastroesophageal reflux disease, unspecified whether esophagitis present Patient is on Protonix now daily, and she is still having  symptoms.  6. Prediabetes Patient's last A1c was 6.1, with no repeat A1c.  She is not on medications.  7. Other iron deficiency anemia Patient's last hemoglobin and hematocrit was low and MCV within normal limits.  Patient is on iron and One-A-Day multivitamins.  8. Stress at home Patient emotionally eats daily at least.  She voices that she  eats as a break from work or when she is stressed.  Assessment/Plan:   1. Other fatigue Cheyenne Keller does feel that her weight is causing her energy to be lower than it should be. Fatigue may be related to obesity, depression or many other causes. Labs will be ordered, and in the meanwhile, Rhena will focus on self care including making healthy food choices, increasing physical activity and focusing on stress reduction.  - EKG 12-Lead - Vitamin B12 - Folate - TSH - T4, free - T3 - Anemia panel  2. SOBOE (shortness of breath on exertion) Cheyenne Keller does feel that she gets out of breath more easily that she used to when she exercises. Cheyenne Keller's shortness of breath appears to be obesity related and exercise induced. She has agreed to work on weight loss and gradually increase exercise to treat her exercise induced shortness of breath. Will continue to monitor closely.  3. Vitamin D deficiency We will check labs today, and we will follow-up at patient's next appointment.  - VITAMIN D 25 Hydroxy (Vit-D Deficiency, Fractures)  4. Mixed hyperlipidemia We will check labs today, and we will follow-up at patient's next appointment.  - Lipid Panel With LDL/HDL Ratio  5. Gastroesophageal reflux disease, unspecified whether esophagitis present Patient was told that she could add Pepcid and or Tums to her current Protonix.  6. Prediabetes We will check labs today, and we will follow-up at patient's next appointment.  - Comprehensive metabolic panel - Insulin, random - Hemoglobin A1c  7. Other iron deficiency anemia We will check labs today, and we will follow-up at patient's next appointment.  - CBC with Differential/Platelet  8. Stress at home Patient agreed to start sertraline 25 mg once daily for 1 week, then increase to 50 mg once daily with no refills.  We will follow-up on her symptoms at her next appointment.  - sertraline (ZOLOFT) 50 MG tablet; Take 1 tablet (50 mg total) by  mouth daily.  Dispense: 30 tablet; Refill: 0  9. Depression screening Cheyenne Keller had a positive depression screening. Depression is commonly associated with obesity and often results in emotional eating behaviors. We will monitor this closely and work on CBT to help improve the non-hunger eating patterns. Referral to Psychology may be required if no improvement is seen as she continues in our clinic.  10. BMI 40.0-44.9, adult (HCC)  11. Obesity with starting BMI of 40.0 Leaner is currently in the action stage of change and her goal is to continue with weight loss efforts. I recommend Alaila begin the structured treatment plan as follows:  She has agreed to the Category 2 Plan.  Exercise goals: No exercise has been prescribed at this time.   Behavioral modification strategies: increasing lean protein intake, meal planning and cooking strategies, keeping healthy foods in the home, and planning for success.  She was informed of the importance of frequent follow-up visits to maximize her success with intensive lifestyle modifications for her multiple health conditions. She was informed we would discuss her lab results at her next visit unless there is a critical issue that needs to be addressed sooner. Cylah agreed to keep  her next visit at the agreed upon time to discuss these results.  Objective:   Blood pressure 96/62, pulse 79, temperature 98.2 F (36.8 C), height 5\' 2"  (1.575 m), weight 218 lb (98.9 kg), last menstrual period 12/19/2017, SpO2 100%. Body mass index is 39.87 kg/m.  EKG: Normal sinus rhythm, rate 81 BPM.  Indirect Calorimeter completed today shows a VO2 of 183 and a REE of 1267.  Her calculated basal metabolic rate is 4098 thus her basal metabolic rate is worse than expected.  General: Cooperative, alert, well developed, in no acute distress. HEENT: Conjunctivae and lids unremarkable. Cardiovascular: Regular rhythm.  Lungs: Normal work of breathing. Neurologic: No  focal deficits.   Lab Results  Component Value Date   CREATININE 0.74 10/10/2021   BUN 12 10/10/2021   NA 136 10/10/2021   K 4.6 10/10/2021   CL 101 10/10/2021   CO2 28 10/10/2021   Lab Results  Component Value Date   ALT 12 05/21/2022   AST 12 05/21/2022   ALKPHOS 78 05/21/2022   BILITOT 0.4 05/21/2022   Lab Results  Component Value Date   HGBA1C 6.1 10/10/2021   No results found for: "INSULIN" Lab Results  Component Value Date   TSH 1.77 10/10/2021   Lab Results  Component Value Date   CHOL 210 (H) 04/18/2022   HDL 57.10 04/18/2022   LDLCALC 141 (H) 04/18/2022   TRIG 58.0 04/18/2022   CHOLHDL 4 04/18/2022   Lab Results  Component Value Date   WBC 7.6 05/21/2022   HGB 11.2 (L) 05/21/2022   HCT 33.8 (L) 05/21/2022   MCV 88.9 05/21/2022   PLT 235.0 05/21/2022   No results found for: "IRON", "TIBC", "FERRITIN"  Attestation Statements:   Reviewed by clinician on day of visit: allergies, medications, problem list, medical history, surgical history, family history, social history, and previous encounter notes.  Time spent on visit including pre-visit chart review and post-visit charting and care was 50 minutes.   I, Burt Knack, am acting as transcriptionist for Reuben Likes, MD. This is the patient's first visit at [facility]. The patient's NEW PATIENT PACKET was reviewed at length. Included in the packet: current and past health history, medications, allergies, ROS, gynecologic history (women only), surgical history, family history, social history, weight history, weight loss surgery history (for those that have had weight loss surgery), nutritional evaluation, mood and food questionnaire, PHQ9, Epworth questionnaire, sleep habits questionnaire, patient life and health improvement goals questionnaire. These will all be scanned into the patient's chart under media.   During the visit, I independently reviewed the patient's EKG, bioimpedance  scale results, and indirect calorimeter results. I used this information to tailor a meal plan for the patient that will help her to lose weight and will improve her obesity-related conditions going forward. I performed a medically necessary appropriate examination and/or evaluation. I discussed the assessment and treatment plan with the patient. The patient was provided an opportunity to ask questions and all were answered. The patient agreed with the plan and demonstrated an understanding of the instructions. Labs were ordered at this visit and will be reviewed at the next visit unless more critical results need to be addressed immediately. Clinical information was updated and documented in the EMR.    I have reviewed the above documentation for accuracy and completeness, and I agree with the above. - Reuben Likes, MD

## 2022-10-02 ENCOUNTER — Ambulatory Visit (INDEPENDENT_AMBULATORY_CARE_PROVIDER_SITE_OTHER): Payer: Commercial Managed Care - PPO | Admitting: Family Medicine

## 2022-10-02 LAB — VITAMIN B12: Vitamin B-12: 735 pg/mL (ref 232–1245)

## 2022-10-02 LAB — LIPID PANEL WITH LDL/HDL RATIO
Cholesterol, Total: 153 mg/dL (ref 100–199)
HDL: 61 mg/dL (ref >39)
LDL Chol Calc (NIH): 77 mg/dL (ref 0–99)
LDL/HDL Ratio: 1.3 ratio (ref 0.0–3.2)
Triglycerides: 77 mg/dL (ref 0–149)
VLDL Cholesterol Cal: 15 mg/dL (ref 5–40)

## 2022-10-02 LAB — COMPREHENSIVE METABOLIC PANEL
ALT: 27 IU/L (ref 0–32)
AST: 19 IU/L (ref 0–40)
Albumin: 4.2 g/dL (ref 3.9–4.9)
Alkaline Phosphatase: 105 IU/L (ref 44–121)
BUN/Creatinine Ratio: 14 (ref 9–23)
BUN: 11 mg/dL (ref 6–24)
Bilirubin Total: 0.6 mg/dL (ref 0.0–1.2)
CO2: 24 mmol/L (ref 20–29)
Calcium: 9.3 mg/dL (ref 8.7–10.2)
Chloride: 100 mmol/L (ref 96–106)
Creatinine, Ser: 0.78 mg/dL (ref 0.57–1.00)
Globulin, Total: 3.5 g/dL (ref 1.5–4.5)
Glucose: 90 mg/dL (ref 70–99)
Potassium: 4.7 mmol/L (ref 3.5–5.2)
Sodium: 137 mmol/L (ref 134–144)
Total Protein: 7.7 g/dL (ref 6.0–8.5)
eGFR: 97 mL/min/{1.73_m2} (ref >59)

## 2022-10-02 LAB — INSULIN, RANDOM: INSULIN: 6.3 u[IU]/mL (ref 2.6–24.9)

## 2022-10-02 LAB — TSH: TSH: 1.67 u[IU]/mL (ref 0.450–4.500)

## 2022-10-02 LAB — T4, FREE: Free T4: 1.28 ng/dL (ref 0.82–1.77)

## 2022-10-02 LAB — VITAMIN D 25 HYDROXY (VIT D DEFICIENCY, FRACTURES): Vit D, 25-Hydroxy: 44.5 ng/mL (ref 30.0–100.0)

## 2022-10-02 LAB — T3: T3, Total: 152 ng/dL (ref 71–180)

## 2022-10-02 LAB — FOLATE: Folate: 16.1 ng/mL (ref >3.0)

## 2022-10-03 LAB — CBC WITH DIFFERENTIAL/PLATELET
Immature Granulocytes: 0 %
Lymphocytes Absolute: 2.2 10*3/uL (ref 0.7–3.1)
Lymphs: 32 %
MCHC: 32.3 g/dL (ref 31.5–35.7)
MCV: 88 fL (ref 79–97)
Neutrophils Absolute: 4.2 10*3/uL (ref 1.4–7.0)
Platelets: 193 10*3/uL (ref 150–450)
RDW: 12.8 % (ref 11.7–15.4)

## 2022-10-03 LAB — ANEMIA PANEL
Ferritin: 266 ng/mL — ABNORMAL HIGH (ref 15–150)
Folate, Hemolysate: 325 ng/mL
Folate, RBC: 953 ng/mL (ref >498)

## 2022-10-03 LAB — HEMOGLOBIN A1C: Est. average glucose Bld gHb Est-mCnc: 131 mg/dL

## 2022-10-04 LAB — CBC WITH DIFFERENTIAL/PLATELET
Basophils Absolute: 0 10*3/uL (ref 0.0–0.2)
Basos: 0 %
EOS (ABSOLUTE): 0.1 10*3/uL (ref 0.0–0.4)
Eos: 1 %
Hemoglobin: 11 g/dL — ABNORMAL LOW (ref 11.1–15.9)
Immature Grans (Abs): 0 10*3/uL (ref 0.0–0.1)
MCH: 28.4 pg (ref 26.6–33.0)
Monocytes Absolute: 0.5 10*3/uL (ref 0.1–0.9)
Monocytes: 8 %
Neutrophils: 59 %
RBC: 3.88 x10E6/uL (ref 3.77–5.28)
WBC: 7 10*3/uL (ref 3.4–10.8)

## 2022-10-04 LAB — ANEMIA PANEL
Hematocrit: 34.1 % (ref 34.0–46.6)
Iron Saturation: 22 % (ref 15–55)
Iron: 60 ug/dL (ref 27–159)
Retic Ct Pct: 2 % (ref 0.6–2.6)
Total Iron Binding Capacity: 272 ug/dL (ref 250–450)
UIBC: 212 ug/dL (ref 131–425)
Vitamin B-12: 976 pg/mL (ref 232–1245)

## 2022-10-04 LAB — HEMOGLOBIN A1C: Hgb A1c MFr Bld: 6.2 % — ABNORMAL HIGH (ref 4.8–5.6)

## 2022-10-14 ENCOUNTER — Encounter (INDEPENDENT_AMBULATORY_CARE_PROVIDER_SITE_OTHER): Payer: Self-pay | Admitting: Family Medicine

## 2022-10-14 ENCOUNTER — Ambulatory Visit (INDEPENDENT_AMBULATORY_CARE_PROVIDER_SITE_OTHER): Payer: Commercial Managed Care - PPO | Admitting: Family Medicine

## 2022-10-14 VITALS — BP 100/63 | HR 71 | Temp 98.1°F | Ht 62.0 in | Wt 211.0 lb

## 2022-10-14 DIAGNOSIS — E7849 Other hyperlipidemia: Secondary | ICD-10-CM | POA: Diagnosis not present

## 2022-10-14 DIAGNOSIS — E559 Vitamin D deficiency, unspecified: Secondary | ICD-10-CM

## 2022-10-14 DIAGNOSIS — R7303 Prediabetes: Secondary | ICD-10-CM

## 2022-10-14 DIAGNOSIS — E669 Obesity, unspecified: Secondary | ICD-10-CM

## 2022-10-14 DIAGNOSIS — D508 Other iron deficiency anemias: Secondary | ICD-10-CM | POA: Diagnosis not present

## 2022-10-14 DIAGNOSIS — Z6838 Body mass index (BMI) 38.0-38.9, adult: Secondary | ICD-10-CM

## 2022-10-14 MED ORDER — VITAMIN D3 125 MCG (5000 UT) PO CAPS: 5000.0000 [IU] | ORAL_CAPSULE | Freq: Every day | ORAL | Status: DC

## 2022-10-14 NOTE — Progress Notes (Signed)
Chief Complaint:   OBESITY Cheyenne Keller is here to discuss her progress with her obesity treatment plan along with follow-up of her obesity related diagnoses. Cheyenne Keller is on the Category 2 Plan and states she is following her eating plan approximately 100% of the time. Cheyenne Keller states she is doing 0 minutes 0 times per week.  Today's visit was #: 2 Starting weight: 218 lbs Starting date: 09/30/2022 Today's weight: 211 lbs Today's date: 10/14/2022 Total lbs lost to date: 7 Total lbs lost since last in-office visit: 7  Interim History: She felt she followed plan 100% of the time.  Felt it was a lot of meat and felt like after a while it became too much meat and so then she had to make a fish and tofu soup with cabbage.  She measure the protein and vegetables.  She really likes the Cheyenne Keller.  The eggs are alright sometimes but she is looking for other breakfast options.  She has been doing quite a bit of sandwiches.  She is wondering about oatmeal as she already has it.  No upcoming plans for the next few weeks.  Subjective:   1. Vitamin D deficiency Patient denies nausea, vomiting, or muscle weakness but notes fatigue. Recent Vitamin D level was of 44.5. I discussed labs with the patient today. I discussed labs with the patient today.   2. Prediabetes Patient's recent A1c was 6.2 and insulin 6.3. She was diagnosed about 1 year ago. Insulin level is surprisingly lower than expected given A1c.  I discussed labs with the patient today.  3. Other iron deficiency anemia Patient is on ferrous sulfate.  Recent hemoglobin was low, iron normal, ferritin elevated, and MCV within normal limits.  I discussed labs with the patient today.  4. Other hyperlipidemia Patient is on atorvastatin 20 mg once daily.  Recent LDL was 77, HDL 61, and triglycerides 77.  I discussed labs with the patient today.  Assessment/Plan:   1. Vitamin D deficiency Patient agreed to increase OTC vitamin D to 5000 units  once daily.  - Cholecalciferol (VITAMIN D3) 125 MCG (5000 UT) CAPS; Take 1 capsule (5,000 Units total) by mouth daily.  2. Prediabetes We will repeat labs in 3 months.  Pathophysiology of insulin resistance, prediabetes, and diabetes mellitus were discussed with the patient today.  No medications at this time, patient will continue her category 2 meal plan.  3. Other iron deficiency anemia Patient will continue multivitamins and iron.  4. Other hyperlipidemia We will repeat FLP in 3 months; pending LDL response may decrease statin.  5. BMI 38.0-38.9,adult  6. Obesity with starting BMI of 40.0 Cheyenne Keller is currently in the action stage of change. As such, her goal is to continue with weight loss efforts. She has agreed to the Category 2 Plan.   Exercise goals: All adults should avoid inactivity. Some physical activity is better than none, and adults who participate in any amount of physical activity gain some health benefits.  Behavioral modification strategies: increasing lean protein intake, meal planning and cooking strategies, keeping healthy foods in the home, and planning for success.  Cheyenne Keller has agreed to follow-up with our clinic in 2 weeks. She was informed of the importance of frequent follow-up visits to maximize her success with intensive lifestyle modifications for her multiple health conditions.   Objective:   Blood pressure 100/63, pulse 71, temperature 98.1 F (36.7 C), height 5\' 2"  (1.575 m), weight 211 lb (95.7 kg), last menstrual period 12/19/2017, SpO2  99%. Body mass index is 38.59 kg/m.  General: Cooperative, alert, well developed, in no acute distress. HEENT: Conjunctivae and lids unremarkable. Cardiovascular: Regular rhythm.  Lungs: Normal work of breathing. Neurologic: No focal deficits.   Lab Results  Component Value Date   CREATININE 0.78 09/30/2022   BUN 11 09/30/2022   NA 137 09/30/2022   K 4.7 09/30/2022   CL 100 09/30/2022   CO2 24 09/30/2022    Lab Results  Component Value Date   ALT 27 09/30/2022   AST 19 09/30/2022   ALKPHOS 105 09/30/2022   BILITOT 0.6 09/30/2022   Lab Results  Component Value Date   HGBA1C 6.2 (H) 10/02/2022   HGBA1C 6.1 10/10/2021   Lab Results  Component Value Date   INSULIN 6.3 09/30/2022   Lab Results  Component Value Date   TSH 1.670 09/30/2022   Lab Results  Component Value Date   CHOL 153 09/30/2022   HDL 61 09/30/2022   LDLCALC 77 09/30/2022   TRIG 77 09/30/2022   CHOLHDL 4 04/18/2022   Lab Results  Component Value Date   VD25OH 44.5 09/30/2022   VD25OH 45.44 01/13/2022   VD25OH 23.10 (L) 10/10/2021   Lab Results  Component Value Date   WBC 7.0 10/02/2022   HGB 11.0 (L) 10/02/2022   HCT 34.1 10/02/2022   MCV 88 10/02/2022   PLT 193 10/02/2022   Lab Results  Component Value Date   IRON 60 10/02/2022   TIBC 272 10/02/2022   FERRITIN 266 (H) 10/02/2022   Attestation Statements:   Reviewed by clinician on day of visit: allergies, medications, problem list, medical history, surgical history, family history, social history, and previous encounter notes.  Time spent on visit including pre-visit chart review and post-visit care and charting was 50 minutes.   I, Burt Knack, am acting as transcriptionist for Reuben Likes, MD.  I have reviewed the above documentation for accuracy and completeness, and I agree with the above. - Reuben Likes, MD

## 2022-10-15 ENCOUNTER — Other Ambulatory Visit: Payer: Self-pay | Admitting: Internal Medicine

## 2022-10-15 DIAGNOSIS — E782 Mixed hyperlipidemia: Secondary | ICD-10-CM

## 2022-10-16 NOTE — Telephone Encounter (Signed)
FYI

## 2022-10-25 ENCOUNTER — Other Ambulatory Visit (INDEPENDENT_AMBULATORY_CARE_PROVIDER_SITE_OTHER): Payer: Self-pay | Admitting: Family Medicine

## 2022-10-25 DIAGNOSIS — F439 Reaction to severe stress, unspecified: Secondary | ICD-10-CM

## 2022-11-01 IMAGING — US US MFM OB DETAIL+14 WK
1 series · 12 of 28 positions shown · non-contrast
Comparison: none

[Series 1: us mfm ob detail+14 wk · 12 of 165 slices shown]
[im 7/165]
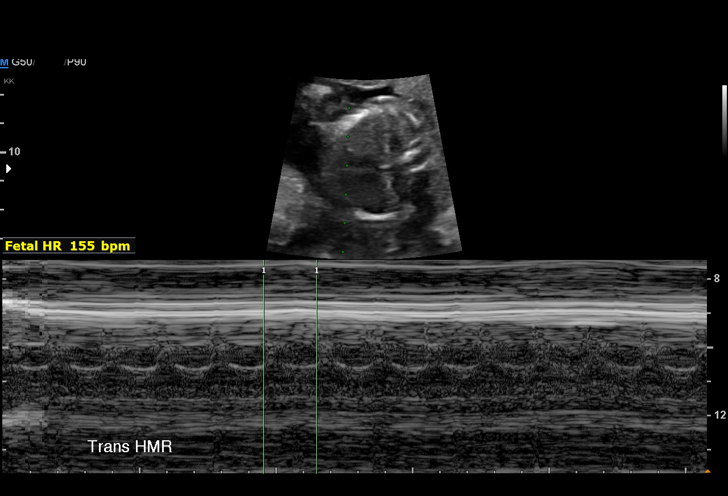
[im 19/165]
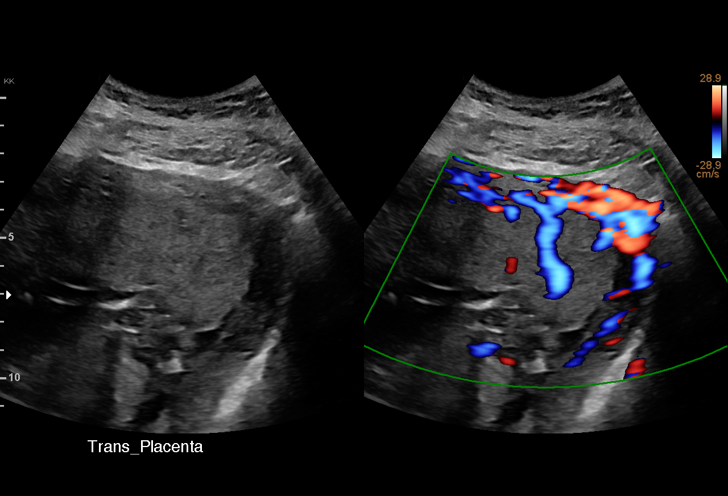
[im 31/165]
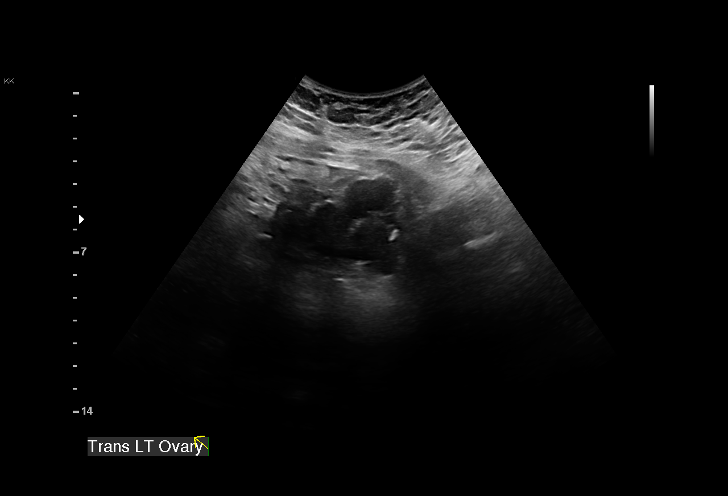
[im 49/165]
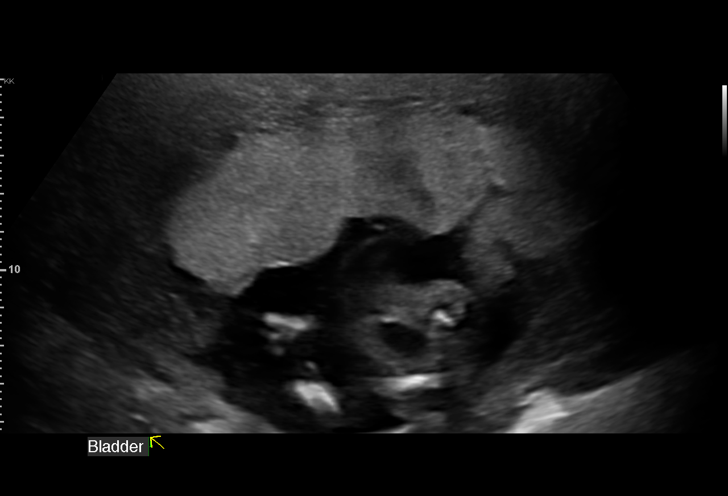
[im 61/165]
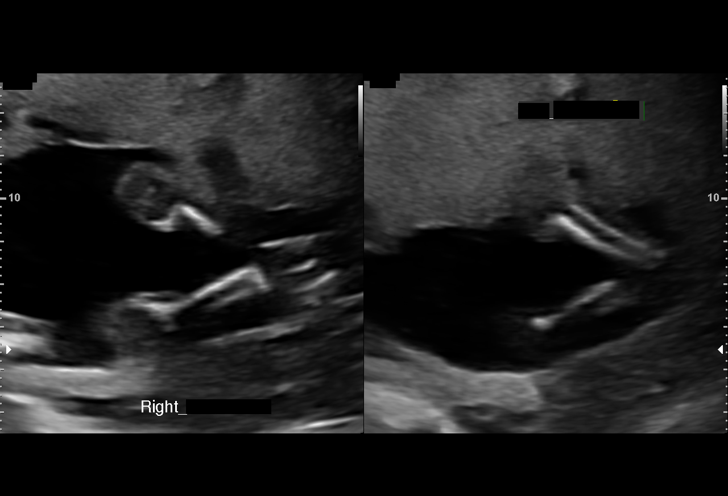
[im 73/165]
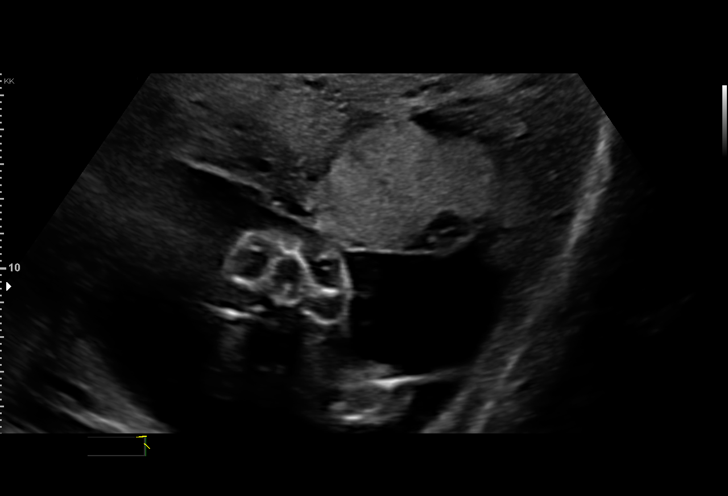
[im 92/165]
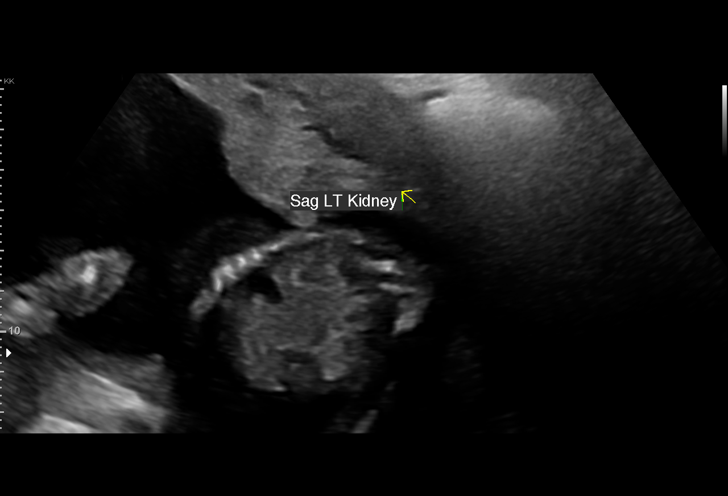
[im 104/165]
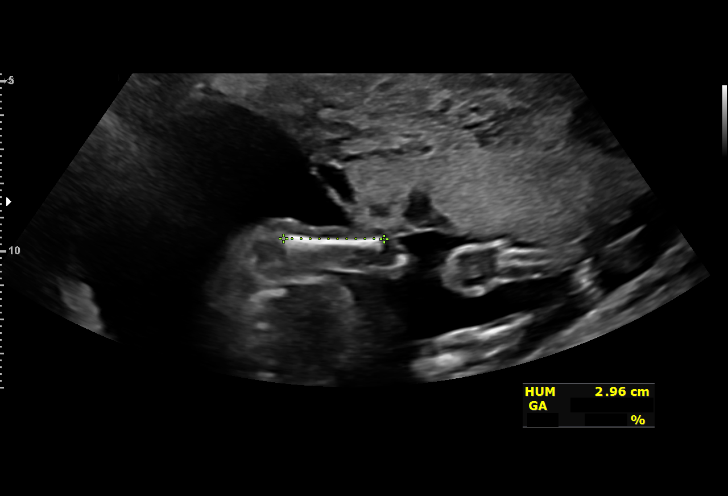
[im 116/165]
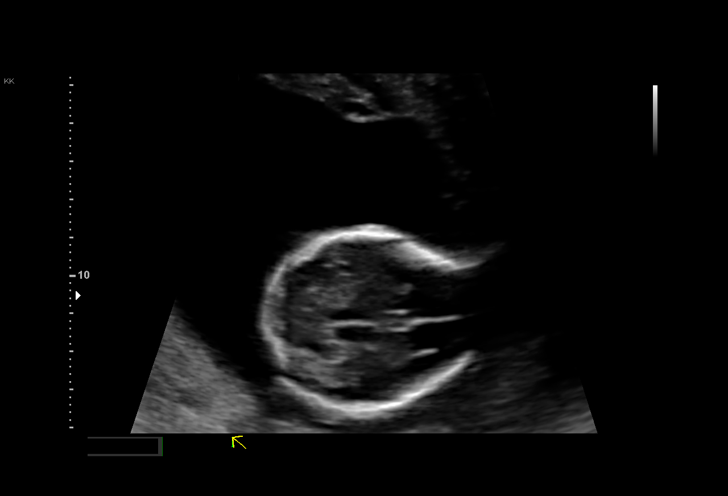
[im 134/165]
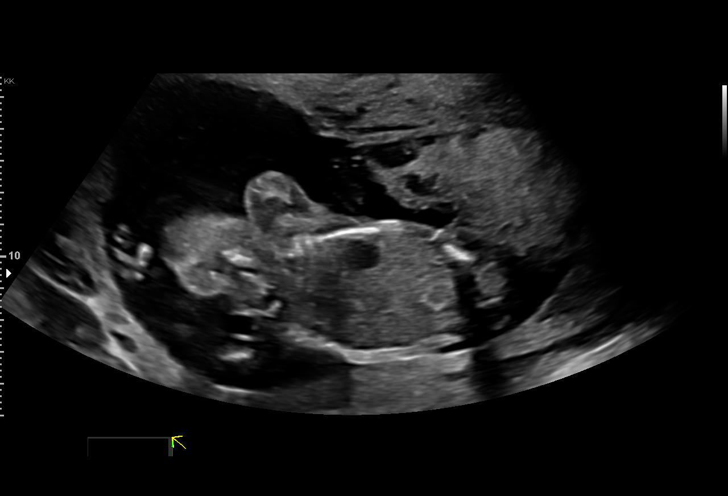
[im 146/165]
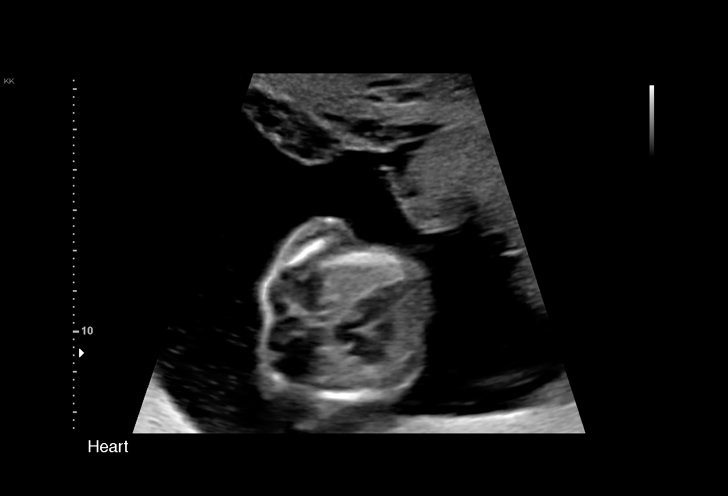
[im 158/165]
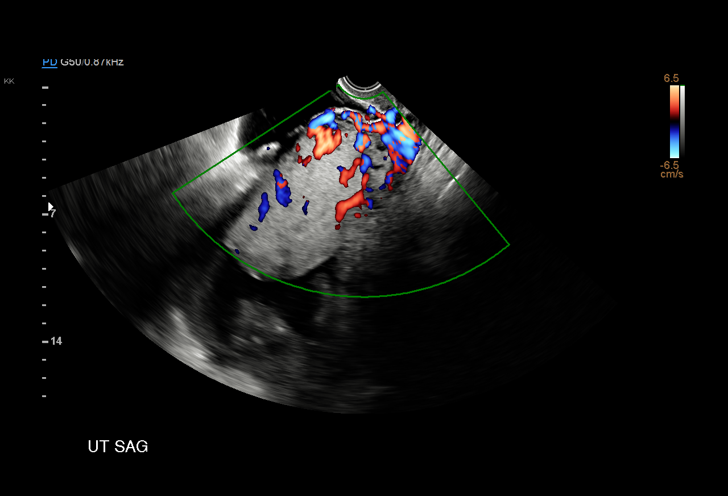

[12 of 28 positions shown; findings below may reference images not displayed]

[facility]

                                                            & Infertility
                                                            [id number] [REDACTED]

Indications

 Advanced maternal age multigravida 35+,
 second trimester
 History of cesarean delivery, currently
 pregnant
 Pregnancy resulting from assisted
 reproductive technology
 Subchorionic hemorrhage, antepartum
 Vaginal bleeding in pregnancy, second
 trimester
 Abnormal biochemical screen
 Encounter for antenatal screening for
 malformations
 19 weeks gestation of pregnancy
 Placenta accreta in second trimester
 Placenta previa with hemorrhage, second
 trimester

Fetal Evaluation

 Num Of Fetuses:         1
 Fetal Heart Rate(bpm):  155
 Cardiac Activity:       Observed
 Presentation:           Breech
 Placenta:               Anterior previa see comments
 P. Cord Insertion:      Not well visualized

 Amniotic Fluid
 AFI FV:      Within normal limits

                             Largest Pocket(cm)


 Comment:    Small subchorionic hemorrhage measures 5.3 x 2.5 x 5.1 cm.
Biometry

 BPD:      46.1  mm     G. Age:  20w 0d         86  %    CI:        72.29   %    70 - 86
                                                         FL/HC:      17.7   %    16.1 -
 HC:      172.5  mm     G. Age:  19w 6d         80  %    HC/AC:      1.18        1.09 -
 AC:      145.6  mm     G. Age:  19w 6d         74  %    FL/BPD:     66.2   %
 FL:       30.5  mm     G. Age:  19w 3d         60  %    FL/AC:      20.9   %    20 - 24
 HUM:      29.6  mm     G. Age:  19w 5d         68  %

 Est. FW:     307  gm    0 lb 11 oz      84  %
OB History

 Gravidity:    4         Term:   1        Prem:   0        SAB:   2
 TOP:          0       Ectopic:  0        Living: 1
Gestational Age

 U/S Today:     19w 6d                                        EDD:   07/20/20
 Best:          19w 0d     Det. By:  Embryo Transfer          EDD:   07/26/20
Anatomy

 Cranium:               Appears normal         Aortic Arch:            Appears normal
 Cavum:                 Appears normal         Ductal Arch:            Appears normal
 Ventricles:            Appears normal         Diaphragm:              Appears normal
 Choroid Plexus:        Appears normal         Stomach:                Appears normal, left
                                                                       sided
 Cerebellum:            Appears normal         Abdomen:                Appears normal
 Posterior Fossa:       Appears normal         Abdominal Wall:         Appears nml (cord
                                                                       insert, abd wall)
 Nuchal Fold:           Appears normal         Cord Vessels:           Appears normal (3
                                                                       vessel cord)
 Face:                  Appears normal         Kidneys:                Appear normal
                        (orbits and profile)
 Lips:                  Not well visualized    Bladder:                Appears normal
 Thoracic:              Appears normal         Spine:                  Limited views
 Heart:                 Not well visualized    Upper Extremities:      Appears normal
 RVOT:                  Not well visualized    Lower Extremities:      Appears normal
 LVOT:                  Not well visualized

 Other:  Fetus appears to be a male. Open hands visualized.
Cervix Uterus Adnexa

 Cervix
 Length:           3.35  cm.
 Normal appearance by transabdominal scan.
 Adnexa
 No abnormality visualized.
Impression

 G4 P1. Patient is here for fetal anatomy scan.
 On screening, MSAFP was increased at 5.22 MoM. Patient
 conceived by IVF-ET. She reports she had PGS that ruled out
 aneuploidies screened.
 Patient has been having vaginal bleeding from 6 weeks'
 gestation till 16 weeks. She has not had vaginal bleeding for
 about 5 to 6 weeks. On your office ultrasound, subchorionic
 hematoma was seen.
 Obstetric history is significant for a term classical cesarean
 delivery in September 2018 of a female infant weighing 7-2 at birth.
 Her daughter is in good health. Classical cesarean delivery
 was performed because of poorly formed lower uterine
 segment and adhesions.
 Patient had myomectomy in the past.
 We performed fetal anatomy scan. No markers of
 aneuploidies or obvious fetal structural defects are seen.
 Fetal biometry is consistent with her previously established
 dates. Amniotic fluid is normal and good fetal activity is seen.
 Spine appears normal but limited because of fetal position.
 Intracranial structures appear normal.
 Subchorionic hematoma measuring 5.3 x 2.5 x 5.1 cm
 (smaller than the measurement obtained at your office).

 Placenta previa is seen on transabdominal scan. No large
 lacunae are seen. Myometrium appears thinned out in the
 lower uterus.
 I explained that placenta previa and previous cesarean (even
 classical) increases the risk of accreta. Patient agreed for
 transvaginal ultrasound that was performed with partially full-
 bladder.
 Very difficult evaluation of cervix. Bladder wall appears
 normal except in the midportion where the myometrium is
 thinned, and bridging vessels are seen. I could not evaluate
 the parametrium clearly. These findings raise the possibility
 of accreta.
 I explained the possibility of accreta and recommended a
 follow-up evaluation in 4 weeks. I did not recommend MRI
 now. MRI may be of value in some cases in confirming
 accreta. Patient was briefly counseled that if placenta accreta
 is confirmed on follow-up scans, she may be transferred to a
 tertiary care center for delivery.  I advised abstinence from
 sexual intercourse because of her history of vaginal bleeding
 and the finding of placenta previa.
 Increased MSAFP can be from vaginal bleeding. Ultrasound
 detects about [DATE] cases of spina bifida.
 Amniocentesis improves the accuracy marginally.
 Amniocentesis also gives information on fetal karyotype.
 Patient is aware of increased risk of aneuploidies associated
 with advanced maternal age. She had opted not to have cell-
 free fetal DNA screening.
 After counseling, she opted not to have amniocentesis. She
 opted not to have genetic counseling today.
Recommendations

 -An appointment was made for her to return in 4 weeks for
 completion of fetal anatomy.
 -Transvaginal ultrasound to evaluate the placenta. Patient to
 have full or partially-full bladder.
                 Rad, Baky

## 2022-11-04 ENCOUNTER — Ambulatory Visit (INDEPENDENT_AMBULATORY_CARE_PROVIDER_SITE_OTHER): Payer: Commercial Managed Care - PPO | Admitting: Family Medicine

## 2022-11-04 ENCOUNTER — Encounter (INDEPENDENT_AMBULATORY_CARE_PROVIDER_SITE_OTHER): Payer: Self-pay | Admitting: Family Medicine

## 2022-11-04 VITALS — BP 95/61 | HR 73 | Temp 97.9°F | Ht 62.0 in | Wt 205.0 lb

## 2022-11-04 DIAGNOSIS — F439 Reaction to severe stress, unspecified: Secondary | ICD-10-CM

## 2022-11-04 DIAGNOSIS — E669 Obesity, unspecified: Secondary | ICD-10-CM

## 2022-11-04 DIAGNOSIS — R7303 Prediabetes: Secondary | ICD-10-CM

## 2022-11-04 DIAGNOSIS — Z6837 Body mass index (BMI) 37.0-37.9, adult: Secondary | ICD-10-CM | POA: Diagnosis not present

## 2022-11-04 MED ORDER — SERTRALINE HCL 50 MG PO TABS
50.0000 mg | ORAL_TABLET | Freq: Every day | ORAL | 0 refills | Status: DC
Start: 2022-11-04 — End: 2023-01-27

## 2022-11-04 NOTE — Progress Notes (Signed)
Chief Complaint:   OBESITY Cheyenne Keller is here to discuss her progress with her obesity treatment plan along with follow-up of her obesity related diagnoses. [name] is on the Category 2 Plan and states she is following her eating plan approximately 98% of the time. [name] states she is doing some walking.   Today's visit was #: 3 Starting weight: 218 lbs Starting date: 09/30/2022 Today's weight: 205 lbs Today's date: 11/04/2022 Total lbs lost to date: 13 Total lbs lost since last in-office visit: 6  Interim History: Patient reports that she is exercising significant control in content of food; eating one french fry or one cheeto when giving food to her kids.  She is working toward getting in all protein throughout the day.  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  For the next few weeks she is planning on going to a wedding in [place] [facility]).   Subjective:   1. Prediabetes Patient's last A1c was 6.2, and she is not on medications.  She notes some cravings but not uncontrollable.  2. Stress at home Patient denies suicidal or homicidal ideations.  She is on sertraline daily with improvement of symptoms.  No side effects were noted.  Assessment/Plan:   1. Prediabetes Patient will continue her category 2 plan with the goal to increase to category 3 pending her hunger and ability to get all of her food in.  2. Stress at home We will refill sertraline 50 mg once daily for 90 days.  - sertraline (ZOLOFT) 50 MG tablet; Take 1 tablet (50 mg total) by mouth daily.  Dispense: 90 tablet; Refill: 0  3. BMI 37.0-37.9, adult  4. Obesity with starting BMI of 40.0 Caitlin is currently in the action stage of change. As such, her goal is to continue with weight loss efforts. She has agreed to the Category 2 Plan.   Exercise goals: All adults should avoid inactivity. Some physical activity is better than none, and adults  who participate in any amount of physical activity gain some health benefits.  Behavioral modification strategies: increasing lean protein intake, meal planning and cooking strategies, keeping healthy foods in the home, and planning for success.  [name] has agreed to follow-up with our clinic in 3 weeks. She was informed of the importance of frequent follow-up visits to maximize her success with intensive lifestyle modifications for her multiple health conditions.   Objective:   Blood pressure 95/61, pulse 73, temperature 97.9 F (36.6 C), height 5\' 2"  (1.575 m), weight 205 lb (93 kg), last menstrual period 12/19/2017, SpO2 100%. Body mass index is 37.49 kg/m.  General: Cooperative, alert, well developed, in no acute distress. HEENT: Conjunctivae and lids unremarkable. Cardiovascular: Regular rhythm.  Lungs: Normal work of breathing. Neurologic: No focal deficits.   Lab Results  Component Value Date   CREATININE 0.78 09/30/2022   BUN 11 09/30/2022   NA 137 09/30/2022   K 4.7 09/30/2022   CL 100 09/30/2022   CO2 24 09/30/2022   Lab Results  Component Value Date   ALT 27 09/30/2022   AST 19 09/30/2022   ALKPHOS 105 09/30/2022   BILITOT 0.6 09/30/2022   Lab Results  Component Value Date   HGBA1C 6.2 (H) 10/02/2022   HGBA1C 6.1 10/10/2021   Lab Results  Component Value Date   INSULIN 6.3 09/30/2022   Lab Results  Component Value Date   TSH 1.670 09/30/2022  Lab Results  Component Value Date   CHOL 153 09/30/2022   HDL 61 09/30/2022   LDLCALC 77 09/30/2022   TRIG 77 09/30/2022   CHOLHDL 4 04/18/2022   Lab Results  Component Value Date   VD25OH 44.5 09/30/2022   VD25OH 45.44 01/13/2022   VD25OH 23.10 (L) 10/10/2021   Lab Results  Component Value Date   WBC 7.0 10/02/2022   HGB 11.0 (L) 10/02/2022   HCT 34.1 10/02/2022   MCV 88 10/02/2022   PLT 193 10/02/2022   Lab Results  Component Value Date   IRON 60 10/02/2022   TIBC 272 10/02/2022   FERRITIN  266 (H) 10/02/2022   Attestation Statements:   Reviewed by clinician on day of visit: allergies, medications, problem list, medical history, surgical history, family history, social history, and previous encounter notes.   I, Burt Knack, am acting as transcriptionist for Reuben Likes, MD.  I have reviewed the above documentation for accuracy and completeness, and I agree with the above. - Reuben Likes, MD

## 2022-11-06 ENCOUNTER — Encounter: Payer: Self-pay | Admitting: Nurse Practitioner

## 2022-11-06 ENCOUNTER — Ambulatory Visit: Payer: Commercial Managed Care - PPO | Admitting: Nurse Practitioner

## 2022-11-06 VITALS — BP 126/72 | HR 67 | Ht 62.0 in | Wt 209.0 lb

## 2022-11-06 DIAGNOSIS — R1013 Epigastric pain: Secondary | ICD-10-CM | POA: Diagnosis not present

## 2022-11-06 DIAGNOSIS — K8020 Calculus of gallbladder without cholecystitis without obstruction: Secondary | ICD-10-CM | POA: Diagnosis not present

## 2022-11-06 NOTE — Progress Notes (Signed)
11/06/2022 Cheyenne Keller [id number] 15-Apr-1980   CHIEF COMPLAINT: Epigastric pain  HISTORY OF PRESENT ILLNESS: Cheyenne Keller is a 42 year old female with a past medical history of asthma, anemia and polycystic ovarian syndrome. Past C-section 09/2018 and 06/2020, myomectomy and vaginal hysterectomy. She presents to our office today as referred by Dr. Chaya Jan for further evaluation regarding epigastric pain. She developed extreme epigastric pain which awakened her from sleep on several occasions March 2024 which abated after she vomited and/or passed a bowel movement. She was seen by her PCP who prescribed Pantoprazole 40 mg once daily which she took for 1 month without improvement. She stopped taking the Pantoprazole for the next month then had recurrent epigastric pain prior to going to [facility] therefore she self prescribed taking Pantoprazole 40mg  two capsule once daily for about 2 weeks. She underwent RUQ sonogram 06/20/2022 which showed stones without evidence of acute cholecystitis.  She was seen by an online GI specialist associated with her place of employment who instructed the patient to take Pantoprazole 1 capsule 30 minutes before breakfast and dinner and not to take two capsules at one time and Sucralfate 1 g nightly. She was referred to a nutritionist at tthe bariatric center and she is avoiding eating fatty foods or anything acidic. She is not consuming any caffeine. Since changing her diet, she feels much better and her epigastric pain nearly abated. However, 3 days ago, she ate fried fish with a small amount of tomato sauce and she had mild dull type pain to the epigastric area. She worries about her risk of cancer.  She also recalled recently eating peppermint ice cream which notably worsened her epigastric pain and she developed heartburn. No NSAID use within the past few months. She typically passes a normal brown formed bowel movement daily,  infrequently has constipation. No rectal bleeding or black stools. She denies ever having an EGD or colonoscopy. No known family history of esophageal, gastric or colorectal cancer.  She has a history of chronic anemia which persists despite undergoing a myomectomy and subsequent hysterectomy. She stated her father has anemia, further details are unknown.  Laboratory studies 7/16 - 10/02/2022 showed a hemoglobin level of 11.0.  Hematocrit 34.1.  MCV 88.  Iron 60.  Iron saturation 22.  TIBC 272.  Ferritin 266.  Vitamin B12 735.  She has taken an iron supplement in the past but not for many months.  She had IDA with past pregnancy which she received IV iron 05/2020 s/p C section 06/2020. Stool antigen was - 05/23/2022.      Latest Ref Rng & Units 10/02/2022    1:56 PM 05/21/2022    2:44 PM 01/13/2022    9:06 AM  CBC  WBC 3.4 - 10.8 x10E3/uL 7.0  7.6  6.3   Hemoglobin 11.1 - 15.9 g/dL 84.1  32.4  40.1   Hematocrit 34.0 - 46.6 % 34.1  33.8  33.9   Platelets 150 - 450 x10E3/uL 193  235.0  193.0        Latest Ref Rng & Units 09/30/2022   12:05 PM 05/21/2022    2:44 PM 10/10/2021   11:19 AM  CMP  Glucose 70 - 99 mg/dL 90   84   BUN 6 - 24 mg/dL 11   12   Creatinine 0.27 - 1.00 mg/dL 2.53   6.64   Sodium 403 - 144 mmol/L 137   136   Potassium 3.5 - 5.2 mmol/L 4.7  4.6   Chloride 96 - 106 mmol/L 100   101   CO2 20 - 29 mmol/L 24   28   Calcium 8.7 - 10.2 mg/dL 9.3   9.4   Total Protein 6.0 - 8.5 g/dL 7.7  7.1  8.1   Total Bilirubin 0.0 - 1.2 mg/dL 0.6  0.4  0.5   Alkaline Phos 44 - 121 IU/L 105  78  82   AST 0 - 40 IU/L 19  12  16    ALT 0 - 32 IU/L 27  12  10    TSH 1.670 on 09/30/2022 Lipase 14 on 05/21/2022  RUQ sonogram 06/20/2022: Gallbladder: Multiple gallstones are noted with the largest measuring 1.4 cm in size. No gallbladder wall thickening visualized. No sonographic Murphy sign noted by sonographer.   Common bile duct: Diameter: 2 mm   Liver: No focal lesion identified. Within normal  limits in parenchymal echogenicity. Portal vein is patent on color Doppler imaging with normal direction of blood flow towards the liver.    IMPRESSION: Cholelithiasis without sonographic evidence of acute cholecystitis.   Past Medical History:  Diagnosis Date   Asthma, exercise induced    Back pain    Fibroid    Gall stones    High cholesterol    Iron deficiency anemia    Lactose intolerance    PCOS (polycystic ovarian syndrome)    Pelvic adhesions    SOB (shortness of breath)    Stomach ulcer    Vitamin D deficiency    Past Surgical History:  Procedure Laterality Date   CESAREAN SECTION N/A 09/28/2018   Procedure: Primary CESAREAN SECTION;  Surgeon: Maxie Better, MD;  Location: [facility] LD ORS;  Service: Obstetrics;  Laterality: N/A;  EDD: 10/19/18   MYOMECTOMY     VAGINAL HYSTERECTOMY  2022   Social History: She is married.  She has 1 son and 1 daughter.  She is a Acupuncturist.  Non-smoker.  No alcohol use.  No drug use.  Family History: Father with history of anemia, disease, anxiety and alcohol use disorder.  With history of a stroke.  No known family history of esophageal, gastric or colorectal cancer.  Allergies  Allergen Reactions   Cinnamon     Tongue rash      Outpatient Encounter Medications as of 11/06/2022  Medication Sig   atorvastatin (LIPITOR) 20 MG tablet TAKE 1 TABLET(20 MG) BY MOUTH DAILY   Cholecalciferol (VITAMIN D3) 125 MCG (5000 UT) CAPS Take 1 capsule (5,000 Units total) by mouth daily.   Ferrous Sulfate (IRON PO) Take by mouth.   LORazepam (ATIVAN) 0.5 MG tablet Take 1 tablet (0.5 mg total) by mouth daily as needed for anxiety (For anxiety with flying).   Magnesium Glycinate 120 MG CAPS Take 240 mg by mouth.   Multiple Vitamin (MULTIVITAMIN ADULT PO) Take by mouth.   Nutritional Supplements (NUTRITIONAL SUPPLEMENT PO) Take by mouth. Orgain collagen peptide   pantoprazole (PROTONIX) 40 MG tablet Take 1 tablet (40 mg total) by mouth daily.  Before breakfast   sertraline (ZOLOFT) 50 MG tablet Take 1 tablet (50 mg total) by mouth daily.   sucralfate (CARAFATE) 1 g tablet Take 1 g by mouth at bedtime.   No facility-administered encounter medications on file as of 11/06/2022.   REVIEW OF SYSTEMS:  Gen: Denies fever, sweats or chills. No weight loss.  CV: Denies chest pain, palpitations or edema. Resp: Denies cough, shortness of breath of hemoptysis.  GI: See HPI. GU: Denies urinary  burning, blood in urine, increased urinary frequency or incontinence. MS: Denies joint pain, muscles aches or weakness. Derm: Denies rash, itchiness, skin lesions or unhealing ulcers. Psych: + Anxiety. Heme: Denies bruising, easy bleeding. Neuro:  Denies headaches, dizziness or paresthesias. Endo:  Denies any problems with DM, thyroid or adrenal function.  PHYSICAL EXAM: Ht 5\' 2"  (1.575 m)   Wt 209 lb (94.8 kg)   LMP 12/19/2017 (Approximate)   BMI 38.23 kg/m  General: in no acute distress. Head: Normocephalic and atraumatic. Eyes:  Sclerae non-icteric, conjunctive pink. Ears: Normal auditory acuity. Mouth: Dentition intact. No ulcers or lesions.  Neck: Supple, no lymphadenopathy or thyromegaly.  Lungs: Clear bilaterally to auscultation without wheezes, crackles or rhonchi. Heart: Regular rate and rhythm. No murmur, rub or gallop appreciated.  Abdomen: Soft, nontender, nondistended. No masses. No hepatosplenomegaly. Normoactive bowel sounds x 4 quadrants.  Midline abdominal scar intact. Rectal: Deferred.  Musculoskeletal: Symmetrical with no gross deformities. Skin: Warm and dry. No rash or lesions on visible extremities. Extremities: No edema. Neurological: Alert oriented x 4, no focal deficits.  Psychological:  Alert and cooperative. Normal mood and affect.  ASSESSMENT AND PLAN:  42 year old female with epigastric pain, initially had a few episodes of nausea and vomiting which abated.  Epigastric pain has improved since changing her  diet, avoiding spicy and acidic foods and on Pantoprazole 40 mg twice daily with Sucralfate 1 g p.o. nightly.  H. pylori stool antigen negative 05/2022.  Gallstones may be a contributing factor. -EGD to rule out GERD/PUD benefits and risks discussed including risk with sedation, risk of bleeding, perforation and infection  -Continue Pantoprazole 40 mg twice daily to be taken 30 minutes before breakfast and dinner -Continue Sucralfate 1 g p.o. nightly for now, patient aware not to take within 2 to 4 hours of any other medication -GERD diet -Further recommendations to be determined after EGD completed  Gallstones per RUQ sonogram 06/2022 without evidence of acute cholecystitis.  Normal LFTs and lipase level. -Patient to continue to monitor symptoms, consider general surgery referral for elective cholecystectomy if EGD unrevealing  Chronic normocytic anemia with normal iron and B12 levels. Prior IDA during pregnancy 06/2020. -Advised patient to follow-up with PCP, consider hematology evaluation.  Father with history of anemia, further details unknown.  Colon cancer screening Screening colonoscopy at the age of 53  CC:  Philip Aspen, Almira Bar*

## 2022-11-06 NOTE — Patient Instructions (Addendum)
You have been scheduled for an endoscopy. Please follow written instructions given to you at your visit today.  If you use inhalers (even only as needed), please bring them with you on the day of your procedure. ________________________________________________  Continue Carafate 1 by mouth at night as needed.  Continue Pantoprazole 40 mg twice daily  Due to recent changes in healthcare laws, you may see the results of your imaging and laboratory studies on MyChart before your provider has had a chance to review them.  We understand that in some cases there may be results that are confusing or concerning to you. Not all laboratory results come back in the same time frame and the provider may be waiting for multiple results in order to interpret others.  Please give [place] 48 hours in order for your provider to thoroughly review all the results before contacting the office for clarification of your results.   Thank you for trusting me with your gastrointestinal care!   Alcide Evener, CRNP

## 2022-11-16 ENCOUNTER — Encounter (INDEPENDENT_AMBULATORY_CARE_PROVIDER_SITE_OTHER): Payer: Self-pay | Admitting: Family Medicine

## 2022-11-18 ENCOUNTER — Ambulatory Visit (INDEPENDENT_AMBULATORY_CARE_PROVIDER_SITE_OTHER): Payer: Commercial Managed Care - PPO | Admitting: Adult Health

## 2022-11-18 ENCOUNTER — Encounter (INDEPENDENT_AMBULATORY_CARE_PROVIDER_SITE_OTHER): Payer: Self-pay | Admitting: Adult Health

## 2022-11-18 VITALS — BP 104/70 | HR 68 | Temp 97.8°F | Ht 62.0 in | Wt 203.0 lb

## 2022-11-18 DIAGNOSIS — E559 Vitamin D deficiency, unspecified: Secondary | ICD-10-CM | POA: Diagnosis not present

## 2022-11-18 DIAGNOSIS — R7303 Prediabetes: Secondary | ICD-10-CM | POA: Diagnosis not present

## 2022-11-18 DIAGNOSIS — Z6837 Body mass index (BMI) 37.0-37.9, adult: Secondary | ICD-10-CM | POA: Diagnosis not present

## 2022-11-18 DIAGNOSIS — E669 Obesity, unspecified: Secondary | ICD-10-CM

## 2022-11-18 NOTE — Progress Notes (Signed)
WEIGHT SUMMARY AND BIOMETRICS  Vitals Temp: 97.8 F (36.6 C) BP: 104/70 Pulse Rate: 68 SpO2: 97 %   Anthropometric Measurements Height: 5\' 2"  (1.575 m) Weight: 203 lb (92.1 kg) BMI (Calculated): 37.12 Weight at Last Visit: 205lb Weight Lost Since Last Visit: 2lb Weight Gained Since Last Visit: 0 Starting Weight: 218lb Total Weight Loss (lbs): 15 lb (6.804 kg)   Body Composition  Body Fat %: 42.7 % Fat Mass (lbs): 86.8 lbs Muscle Mass (lbs): 110.6 lbs Total Body Water (lbs): 79.6 lbs Visceral Fat Rating : 11   Other Clinical Data Fasting: yes Labs: no Today's Visit #: 4 Starting Date: 09/30/22    Chief Complaint:   OBESITY Cheyenne Keller is here to discuss her progress with her obesity treatment plan. She is on the the Category 2 Plan and states she is following her eating plan approximately 98 % of the time. She states she is not currently exercising.   Interim History:  [name] will fly to [place] this Thursday 11/20/2022- family wedding- discussed travel strategies  She is reports drowsiness when she takes nightly Sertraline 50mg - started on/about 09/30/2022.  She very infrequently uses PRN Lorazepam 0.5mg - needed when she flies.  PDMP reviewed- 10 tablets refilled on 09/03/2022  Due to sedation concerns- hold Sertraline dose if you need to take PRN Lorzazepam 0.5mg - then restart SSRI next day and continue as directed.  You should avoid grapefruit juice, alcohol, St. John's wort, and fatty foods- when taking daily Sertraline.  Subjective:   1. Vitamin D deficiency  Latest Reference Range & Units 09/30/22 12:05  Vitamin D, 25-Hydroxy 30.0 - 100.0 ng/mL 44.5   She has been inconsistently taking daily Vit D 3 5,000 international units    2. Prediabetes Lab Results  Component Value Date   HGBA1C 6.2 (H) 10/02/2022   HGBA1C 6.1 10/10/2021    She is not on any antidiabetic medications. She denies polyphagia. She has been VERY consistent with  prescribed Cat 2 meal plan, >98% She is down a total of 15 lbs since starting with HWW on 09/30/2022  Assessment/Plan:   1. Vitamin D deficiency Take Vit D 3 5,000 international units DAILY  2. Prediabetes Continue to consume protein at each meal  3. Current BMI 37.12  Cheyenne Keller is currently in the action stage of change. As such, her goal is to continue with weight loss efforts. She has agreed to the Category 2 Plan.   Exercise goals: All adults should avoid inactivity. Some physical activity is better than none, and adults who participate in any amount of physical activity gain some health benefits.  Behavioral modification strategies: increasing lean protein intake, decreasing simple carbohydrates, increasing vegetables, increasing water intake, no skipping meals, meal planning and cooking strategies, and planning for success.  Cheyenne Keller has agreed to follow-up with our clinic in 3 weeks. She was informed of the importance of frequent follow-up visits to maximize her success with intensive lifestyle modifications for her multiple health conditions.   Objective:   Blood pressure 104/70, pulse 68, temperature 97.8 F (36.6 C), height 5\' 2"  (1.575 m), weight 203 lb (92.1 kg), last menstrual period 12/19/2017, SpO2 97%. Body mass index is 37.13 kg/m.  General: Cooperative, alert, well developed, in no acute distress. HEENT: Conjunctivae and lids unremarkable. Cardiovascular: Regular rhythm.  Lungs: Normal work of breathing. Neurologic: No focal deficits.   Lab Results  Component Value Date   CREATININE 0.78 09/30/2022   BUN 11 09/30/2022   NA 137 09/30/2022  K 4.7 09/30/2022   CL 100 09/30/2022   CO2 24 09/30/2022   Lab Results  Component Value Date   ALT 27 09/30/2022   AST 19 09/30/2022   ALKPHOS 105 09/30/2022   BILITOT 0.6 09/30/2022   Lab Results  Component Value Date   HGBA1C 6.2 (H) 10/02/2022   HGBA1C 6.1 10/10/2021   Lab Results  Component Value Date    INSULIN 6.3 09/30/2022   Lab Results  Component Value Date   TSH 1.670 09/30/2022   Lab Results  Component Value Date   CHOL 153 09/30/2022   HDL 61 09/30/2022   LDLCALC 77 09/30/2022   TRIG 77 09/30/2022   CHOLHDL 4 04/18/2022   Lab Results  Component Value Date   VD25OH 44.5 09/30/2022   VD25OH 45.44 01/13/2022   VD25OH 23.10 (L) 10/10/2021   Lab Results  Component Value Date   WBC 7.0 10/02/2022   HGB 11.0 (L) 10/02/2022   HCT 34.1 10/02/2022   MCV 88 10/02/2022   PLT 193 10/02/2022   Lab Results  Component Value Date   IRON 60 10/02/2022   TIBC 272 10/02/2022   FERRITIN 266 (H) 10/02/2022   Attestation Statements:   Reviewed by clinician on day of visit: allergies, medications, problem list, medical history, surgical history, family history, social history, and previous encounter notes.  Time spent on visit including pre-visit chart review and post-visit care and charting was 28 minutes.   I have reviewed the above documentation for accuracy and completeness, and I agree with the above. -  Emmaleigh Longo d. Natonya Finstad, NP-C

## 2022-11-29 IMAGING — US US MFM OB FOLLOW-UP
1 series · 12 of 28 positions shown · non-contrast
Comparison: none

[Series 1: us mfm ob follow-up · 80 acquisitions, 12 frames shown]
[im 3/80]
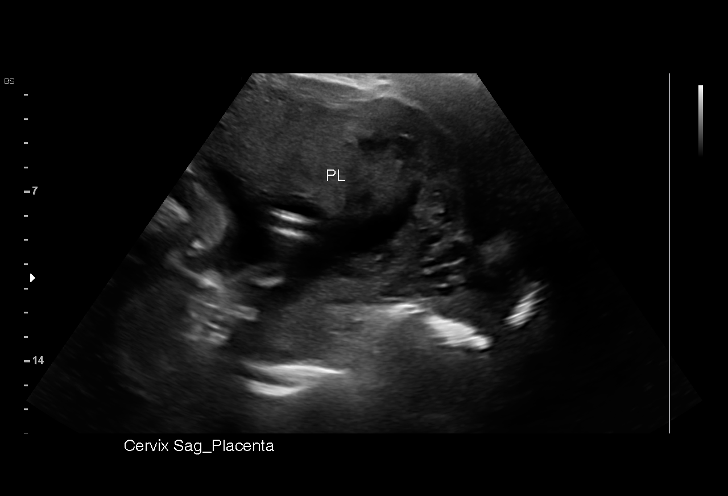
[im 9/80]
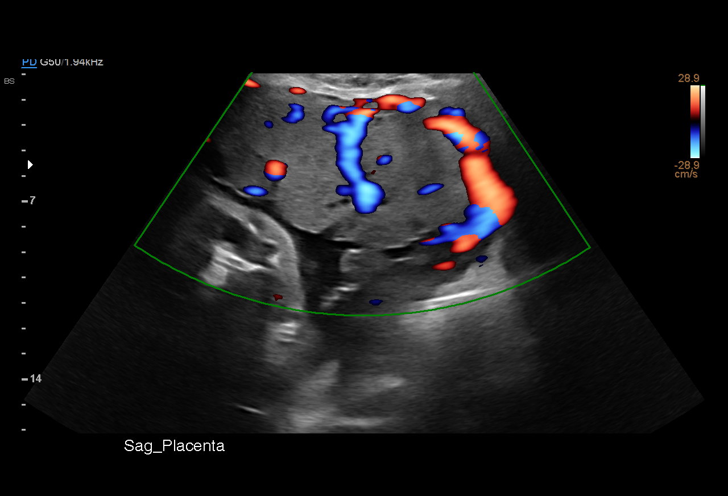
[im 15/80]
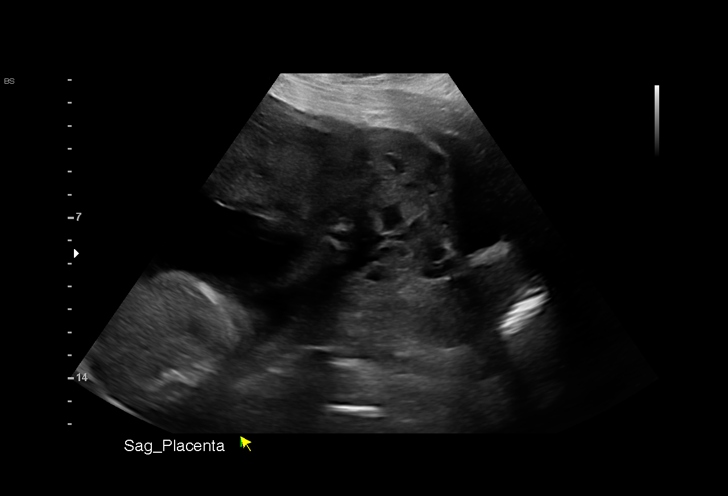
[im 24/80]
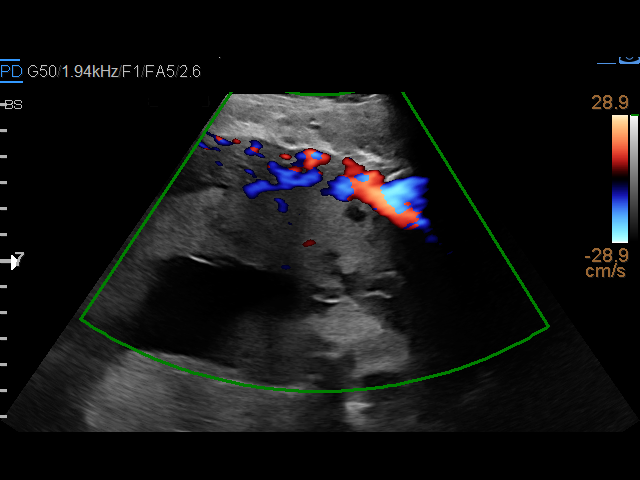
[im 30/80]
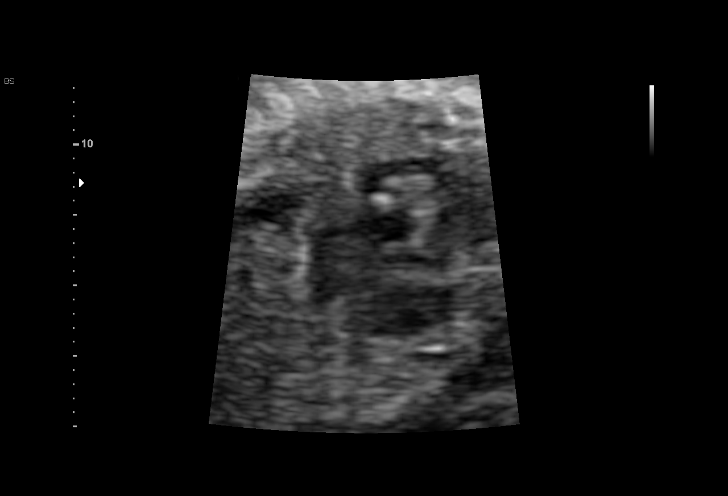
[im 36/80]
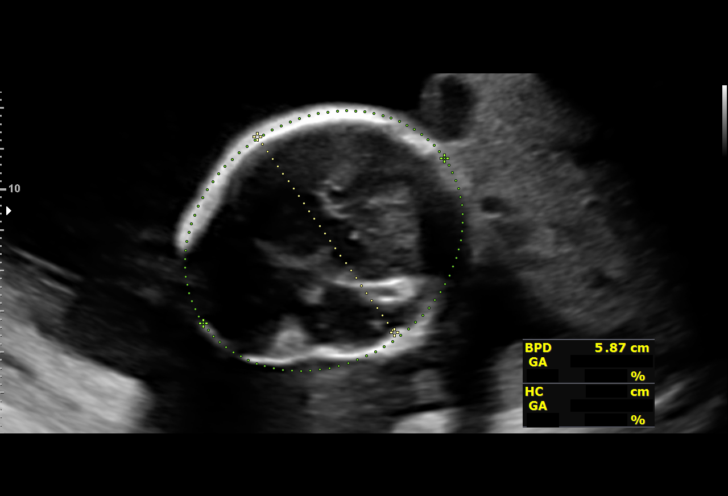
[im 44/80]
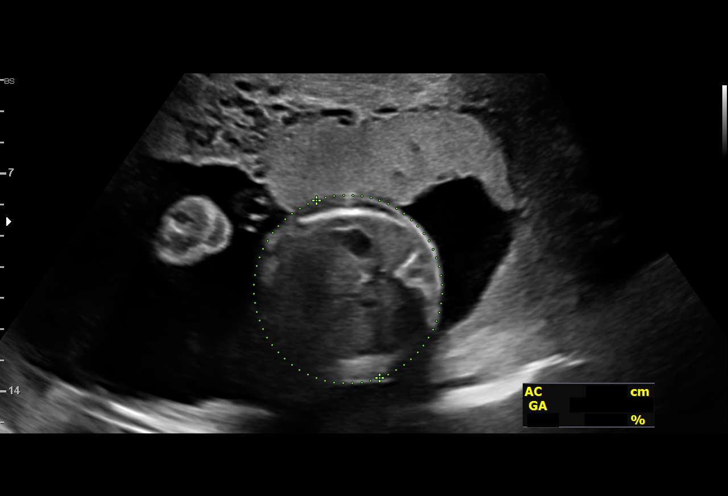
[im 50/80]
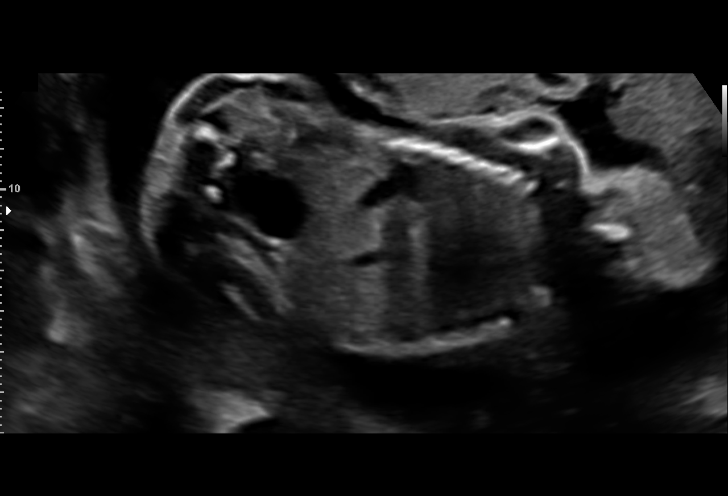
[im 56/80]
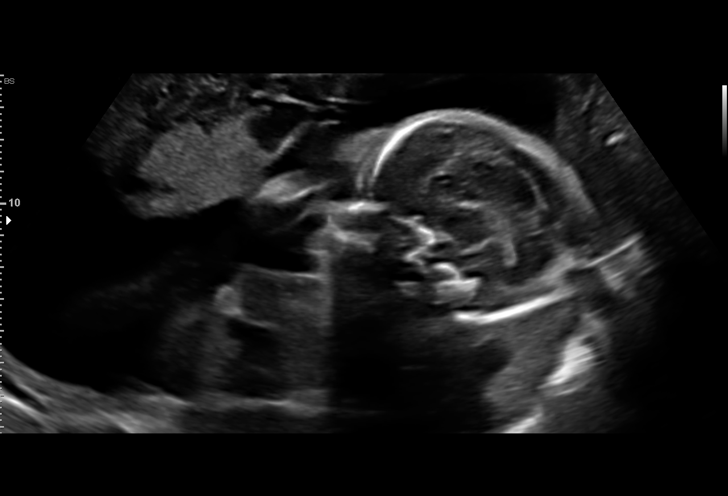
[im 65/80]
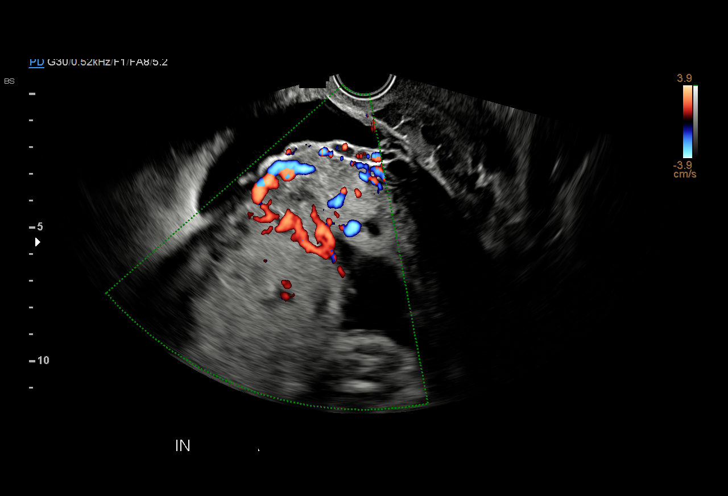
[im 71/80]
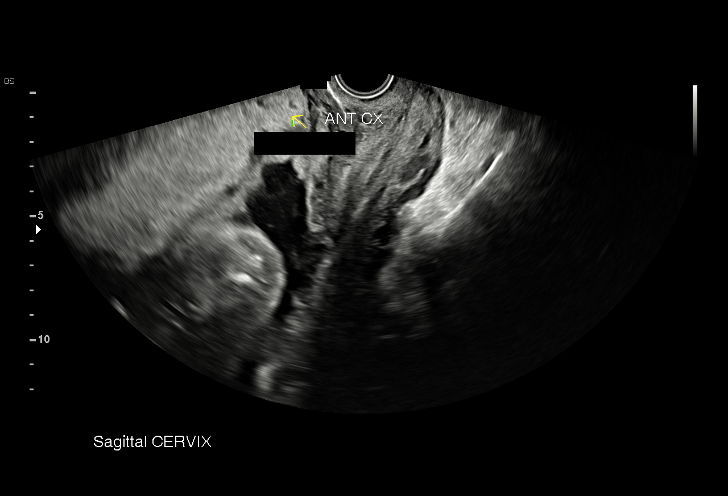
[im 77/80]
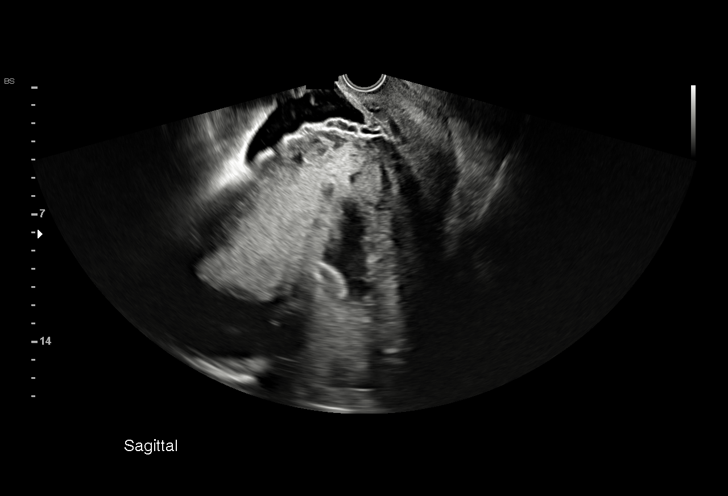

[12 of 28 positions shown; findings below may reference images not displayed]

[facility]

                                                            & Infertility
                                                            4142 [REDACTED]

Indications

 23 weeks gestation of pregnancy
 Advanced maternal age multigravida 35+,
 second trimester
 History of cesarean delivery, currently
 pregnant
 Pregnancy resulting from assisted
 reproductive technology
 Abnormal biochemical screen
 Encounter for antenatal screening for
 malformations
 Placenta accreta in second trimester
Fetal Evaluation

 Num Of Fetuses:         1
 Fetal Heart Rate(bpm):  140
 Cardiac Activity:       Observed
 Presentation:           Cephalic
 Placenta:               Placenta Accreta spectrum
 P. Cord Insertion:      Not well visualized
 Amniotic Fluid
 AFI FV:      Within normal limits

                             Largest Pocket(cm)

Biometry

 BPD:      58.8  mm     G. Age:  24w 0d         82  %    CI:        79.18   %    70 - 86
                                                         FL/HC:      19.9   %    19.2 -
 HC:      208.9  mm     G. Age:  23w 0d         34  %    HC/AC:      1.11        1.05 -
 AC:      188.5  mm     G. Age:  23w 4d         62  %    FL/BPD:     70.6   %    71 - 87
 FL:       41.5  mm     G. Age:  23w 3d         55  %    FL/AC:      22.0   %    20 - 24

 Est. FW:     603  gm      1 lb 5 oz     69  %
OB History

 Gravidity:    4         Term:   1        Prem:   0        SAB:   2
 TOP:          0       Ectopic:  0        Living: 1
Gestational Age

 U/S Today:     23w 4d                                        EDD:   07/22/20
 Best:          23w 0d     Det. By:  Embryo Transfer          EDD:   07/26/20
Anatomy

 Cranium:               Previously seen        LVOT:                   Not well visualized
 Cavum:                 Previously seen        Aortic Arch:            Previously seen
 Ventricles:            Previously seen        Ductal Arch:            Previously seen
 Choroid Plexus:        Previously seen        Diaphragm:              Previously seen
 Cerebellum:            Previously seen        Stomach:                Previously Seen
 Posterior Fossa:       Previously seen        Abdomen:                Previously seen
 Nuchal Fold:           Previously seen        Abdominal Wall:         Previously seen
 Face:                  Orbits and profile     Cord Vessels:           Previously seen
                        previously seen
 Lips:                  Not well visualized    Kidneys:                Appear normal
 Palate:                Not well visualized    Bladder:                Appears normal
 Thoracic:              Appears normal         Spine:                  Limited views
 Heart:                 Not well visualized    Upper Extremities:      Previously seen
 RVOT:                  Not well visualized    Lower Extremities:      Previously seen

 Other:  Fetus appears to be a male. Open hands prev visualized.
Cervix Uterus Adnexa

 Cervix
 Length:           4.84  cm.
 Normal appearance by transabdominal scan.
Impression

 Patient returned for fetal growth assessment and transvaginal
 assessment of placental location. She does not give history
 of vaginal bleeding.
 Fetal growth is appropriate for gestational age. Amniotic fluid
 is normal and good fetal activity is seen. Fetal anatomical
 survey could still not be completed because of fetal position.
 We performed transvaginal ultrasound with partially-full
 bladder to evaluate the placenta.
 Following findings are seen:
 -Multiple irregular lacunae in the placenta. Some had blood
 flow on color-Doppler.
 -Uterovesical hypervascularity is seen.  Myometrial-bladder
 interface is lost and no clear uterovesical space is seen.

 bridging vessels from the placenta invading the bladder
 mucosa are seen.
 -No myometrial thinning is seen anteriorly in the upper
 segment. Placental bulge is seen in the lower uterine
 segment.
 -Only posterior cervix could be clearly identified. Placenta
 seems to have invaded anterior cervix and color-Doppler flow
 showed hypervascularity.
 -I cannot rule out parametrial invasioin.
 Ultrasound findings raise a strong possibility of Placenta
 Accreta Spectrum (PAS). Findings likely to correlate with
 Grade 3a (Placenta increta with bladder invasion) or Grade
 3b (placenta increta with invasion of pelvic tissue/organs
 (FIGO Classification 7231). FIGO Classification is a
 pathological diagnosis and correlation with ultrasound is not
 consistent.
 I counseled the patient on the following:
 -Ultrasound findings suggest placenta accreta spectrum.
 Explained with help of ultrasound images and diagrams.
 -I recommended that she deliver at [REDACTED] with
 Gyn-Oncology services.
 -If confirmed at delivery, hysterectomy after delivery would be
 performed.
 -Femoral catheterization (Interventional Radiology) may be
 recommended by the surgical team.
 -MRI is helpful to identify the extent of parametrial invasion.
 Since PAS is a progressive disorder, MRI can be deferred till
 later gestation.
 -Patients with PAS are delivered at 36 weeks after antenatal
 corticosteroids. If she has vaginal bleeding, inpatient
 management and preterm delivery is likely.
 -Increased MSAFP is likely due to PAS.
 Patient's husband was on the phone during counseling and
 both took active part in counseling session. She informed that
 her primary concern is the well-being of her baby and
 hysterectomy to prevent maternal complications is
 acceptable. I reassured her that in the absence of vaginal
 bleeding that would lead to preterm delivery, the neonatal
 outcome should be favorable.
 I advised her to not to have sexual intercourse till delivery.
 I informed Dr. Hinojo. Patient will discuss with him and
 decide on place of delivery.
 If patient, after transfer of care, desires to have ultrasound at
 our office for convenience, we will be happy to provide the
 services.
Recommendations

 -No follow-up appointments were ma[REDACTED]

## 2022-12-16 ENCOUNTER — Ambulatory Visit (INDEPENDENT_AMBULATORY_CARE_PROVIDER_SITE_OTHER): Payer: Commercial Managed Care - PPO | Admitting: Family Medicine

## 2022-12-16 ENCOUNTER — Encounter (INDEPENDENT_AMBULATORY_CARE_PROVIDER_SITE_OTHER): Payer: Self-pay | Admitting: Family Medicine

## 2022-12-16 VITALS — BP 91/56 | HR 82 | Temp 98.0°F | Ht 62.0 in | Wt 202.0 lb

## 2022-12-16 DIAGNOSIS — R7303 Prediabetes: Secondary | ICD-10-CM | POA: Diagnosis not present

## 2022-12-16 DIAGNOSIS — F439 Reaction to severe stress, unspecified: Secondary | ICD-10-CM

## 2022-12-16 DIAGNOSIS — E669 Obesity, unspecified: Secondary | ICD-10-CM | POA: Diagnosis not present

## 2022-12-16 DIAGNOSIS — Z6837 Body mass index (BMI) 37.0-37.9, adult: Secondary | ICD-10-CM | POA: Diagnosis not present

## 2022-12-16 NOTE — Progress Notes (Signed)
Chief Complaint:   OBESITY Cheyenne Keller is here to discuss her progress with her obesity treatment plan along with follow-up of her obesity related diagnoses. Cheyenne Keller is on the Category 2 Plan and states she is following her eating plan approximately 70% of the time. Cheyenne Keller states she is doing 0 minutes 0 times per week.  Today's visit was #: 5 Starting weight: 218 lbs Starting date: 09/30/2022 Today's weight: 202 lbs Today's date: 12/16/2022 Total lbs lost to date: 16 Total lbs lost since last in-office visit: 1  Interim History: Patient voices she went to a wedding in [place] at the beginning of last month.  She mentions once she started eating cake and that must have triggered something and she is experiencing significant symptoms from her ulcer.  She also has been sneaking food here and there.  She is wondering how to get better on the plan.   Subjective:   1. Prediabetes Patient is not on medications.  She does not like medications to begin with per the patient.  2. Stress at home Patient had to stop sertraline due to reflux symptoms.  She denies suicidal or homicidal ideations, but she is more stressed due to reflux.  Assessment/Plan:   1. Prediabetes We will repeat labs in 2 months.  2. Stress at home Patient is to start with her meal plan and after 1 week restart sertraline 25 mg.  3. BMI 37.0-37.9, adult  4. Obesity with starting BMI of 40.0 Cheyenne Keller is currently in the action stage of change. As such, her goal is to continue with weight loss efforts. She has agreed to the Category 2 Plan.   Exercise goals: No exercise has been prescribed at this time.  Behavioral modification strategies: increasing lean protein intake, meal planning and cooking strategies, keeping healthy foods in the home, and planning for success.  Cheyenne Keller has agreed to follow-up with our clinic in 3 weeks. She was informed of the importance of frequent follow-up visits to maximize her  success with intensive lifestyle modifications for her multiple health conditions.   Objective:   Blood pressure (!) 91/56, pulse 82, temperature 98 F (36.7 C), height 5\' 2"  (1.575 m), weight 202 lb (91.6 kg), last menstrual period 12/19/2017, SpO2 98%. Body mass index is 36.95 kg/m.  General: Cooperative, alert, well developed, in no acute distress. HEENT: Conjunctivae and lids unremarkable. Cardiovascular: Regular rhythm.  Lungs: Normal work of breathing. Neurologic: No focal deficits.   Lab Results  Component Value Date   CREATININE 0.78 09/30/2022   BUN 11 09/30/2022   NA 137 09/30/2022   K 4.7 09/30/2022   CL 100 09/30/2022   CO2 24 09/30/2022   Lab Results  Component Value Date   ALT 27 09/30/2022   AST 19 09/30/2022   ALKPHOS 105 09/30/2022   BILITOT 0.6 09/30/2022   Lab Results  Component Value Date   HGBA1C 6.2 (H) 10/02/2022   HGBA1C 6.1 10/10/2021   Lab Results  Component Value Date   INSULIN 6.3 09/30/2022   Lab Results  Component Value Date   TSH 1.670 09/30/2022   Lab Results  Component Value Date   CHOL 153 09/30/2022   HDL 61 09/30/2022   LDLCALC 77 09/30/2022   TRIG 77 09/30/2022   CHOLHDL 4 04/18/2022   Lab Results  Component Value Date   VD25OH 44.5 09/30/2022   VD25OH 45.44 01/13/2022   VD25OH 23.10 (L) 10/10/2021   Lab Results  Component Value Date   WBC 7.0  10/02/2022   HGB 11.0 (L) 10/02/2022   HCT 34.1 10/02/2022   MCV 88 10/02/2022   PLT 193 10/02/2022   Lab Results  Component Value Date   IRON 60 10/02/2022   TIBC 272 10/02/2022   FERRITIN 266 (H) 10/02/2022   Attestation Statements:   Reviewed by clinician on day of visit: allergies, medications, problem list, medical history, surgical history, family history, social history, and previous encounter notes.  Time spent on visit including pre-visit chart review and post-visit care and charting was 30 minutes.   I, Burt Knack, am acting as transcriptionist for  Reuben Likes, MD.  I have reviewed the above documentation for accuracy and completeness, and I agree with the above. - Reuben Likes, MD

## 2022-12-19 ENCOUNTER — Other Ambulatory Visit: Payer: Self-pay | Admitting: Internal Medicine

## 2022-12-19 DIAGNOSIS — R1013 Epigastric pain: Secondary | ICD-10-CM

## 2022-12-23 ENCOUNTER — Encounter: Payer: Self-pay | Admitting: Certified Registered Nurse Anesthetist

## 2022-12-30 ENCOUNTER — Encounter: Payer: Self-pay | Admitting: Gastroenterology

## 2022-12-30 ENCOUNTER — Ambulatory Visit (AMBULATORY_SURGERY_CENTER): Payer: Commercial Managed Care - PPO | Admitting: Gastroenterology

## 2022-12-30 VITALS — BP 118/66 | HR 56 | Temp 98.0°F | Resp 9 | Ht 62.0 in | Wt 209.0 lb

## 2022-12-30 DIAGNOSIS — R1013 Epigastric pain: Secondary | ICD-10-CM

## 2022-12-30 MED ORDER — SODIUM CHLORIDE 0.9 % IV SOLN: 500.0000 mL | Freq: Once | INTRAVENOUS | Status: DC

## 2022-12-30 NOTE — Op Note (Signed)
[facility] Patient Name: Cheyenne Keller Procedure Date: 12/30/2022 10:27 AM MRN: [id number] Endoscopist: Napoleon Form , MD, [id number] Age: 42 Referring MD:  Date of Birth: 1980-03-22 Gender: Female Account #: [id number] Procedure:                Upper GI endoscopy Indications:              Epigastric abdominal pain Medicines:                Monitored Anesthesia Care Procedure:                Pre-Anesthesia Assessment:                           - Prior to the procedure, a History and Physical                            was performed, and patient medications and                            allergies were reviewed. The patient's tolerance of                            previous anesthesia was also reviewed. The risks                            and benefits of the procedure and the sedation                            options and risks were discussed with the patient.                            All questions were answered, and informed consent                            was obtained. Prior Anticoagulants: The patient has                            taken no anticoagulant or antiplatelet agents. ASA                            Grade Assessment: II - A patient with mild systemic                            disease. After reviewing the risks and benefits,                            the patient was deemed in satisfactory condition to                            undergo the procedure.                           After obtaining informed consent, the endoscope was  passed under direct vision. Throughout the                            procedure, the patient's blood pressure, pulse, and                            oxygen saturations were monitored continuously. The                            GIF [id number] #[id number] was introduced through the                            mouth, and advanced to the second part of duodenum.                            The upper GI  endoscopy was accomplished without                            difficulty. The patient tolerated the procedure                            well. Scope In: Scope Out: Findings:                 The Z-line was regular and was found 40 cm from the                            incisors.                           The examined esophagus was normal.                           The entire examined stomach was normal.                           The cardia and gastric fundus were normal on                            retroflexion.                           The examined duodenum was normal. Complications:            No immediate complications. Estimated Blood Loss:     Estimated blood loss: none. Impression:               - Z-line regular, 40 cm from the incisors.                           - Normal esophagus.                           - Normal stomach.                           - Normal examined duodenum.                           -  No specimens collected. Recommendation:           - Patient has a contact number available for                            emergencies. The signs and symptoms of potential                            delayed complications were discussed with the                            patient. Return to normal activities tomorrow.                            Written discharge instructions were provided to the                            patient.                           - Resume previous diet.                           - Continue present medications.                           - Refer to a surgeon at appointment to be scheduled                            to discuss possible cholecystectomy, h/o gallstones                            with largest diameter 1.4cm. Napoleon Form, MD 12/30/2022 10:48:32 AM This report has been signed electronically.

## 2022-12-30 NOTE — Patient Instructions (Signed)
YOU HAD AN ENDOSCOPIC PROCEDURE TODAY AT THE [facility]:   Refer to the procedure report that was given to you for any specific questions about what was found during the examination.  If the procedure report does not answer your questions, please call your gastroenterologist to clarify.  If you requested that your care partner not be given the details of your procedure findings, then the procedure report has been included in a sealed envelope for you to review at your convenience later.  YOU SHOULD EXPECT: Some feelings of bloating in the abdomen. Passage of more gas than usual.  Walking can help get rid of the air that was put into your GI tract during the procedure and reduce the bloating. If you had a lower endoscopy (such as a colonoscopy or flexible sigmoidoscopy) you may notice spotting of blood in your stool or on the toilet paper. If you underwent a bowel prep for your procedure, you may not have a normal bowel movement for a few days.  Please Note:  You might notice some irritation and congestion in your nose or some drainage.  This is from the oxygen used during your procedure.  There is no need for concern and it should clear up in a day or so.  SYMPTOMS TO REPORT IMMEDIATELY:  Following upper endoscopy (EGD)  Vomiting of blood or coffee ground material  New chest pain or pain under the shoulder blades  Painful or persistently difficult swallowing  New shortness of breath  Fever of 100F or higher  Black, tarry-looking stools  For urgent or emergent issues, a gastroenterologist can be reached at any hour by calling [phone]. Do not use MyChart messaging for urgent concerns.    DIET:  We do recommend a small meal at first, but then you may proceed to your regular diet.  Drink plenty of fluids but you should avoid alcoholic beverages for 24 hours.  ACTIVITY:  You should plan to take it easy for the rest of today and you should NOT DRIVE or use heavy machinery until  tomorrow (because of the sedation medicines used during the test).    FOLLOW UP: Our staff will call the number listed on your records the next business day following your procedure.  We will call around 7:15- 8:00 am to check on you and address any questions or concerns that you may have regarding the information given to you following your procedure. If we do not reach you, we will leave a message.     SIGNATURES/CONFIDENTIALITY: You and/or your care partner have signed paperwork which will be entered into your electronic medical record.  These signatures attest to the fact that that the information above on your After Visit Summary has been reviewed and is understood.  Full responsibility of the confidentiality of this discharge information lies with you and/or your care-partner.

## 2022-12-30 NOTE — Progress Notes (Signed)
1039  Pt experienced laryngeal spasm with jaw thrust performed. vss

## 2022-12-30 NOTE — Progress Notes (Signed)
Report given to PACU, vss 

## 2022-12-30 NOTE — Progress Notes (Signed)
[facility] History and Physical   Primary Care Physician:  Philip Aspen, Limmie Patricia, MD   Reason for Procedure:  Epigastric pain  Plan:    EGD  with possible interventions as needed     HPI: Cheyenne Keller is a very pleasant 42 y.o. female here for EGD for evaluation of epigastric pain. Denies any nausea, vomiting,  melena or bright red blood per rectum  The risks and benefits as well as alternatives of endoscopic procedure(s) have been discussed and reviewed. All questions answered. The patient agrees to proceed.    Past Medical History:  Diagnosis Date   Anxiety    Arthritis    Asthma, exercise induced    Back pain    Fibroid    Gall stones    High cholesterol    Iron deficiency anemia    Lactose intolerance    PCOS (polycystic ovarian syndrome)    Pelvic adhesions    SOB (shortness of breath)    Stomach ulcer    Vitamin D deficiency     Past Surgical History:  Procedure Laterality Date   CESAREAN SECTION N/A 09/28/2018   Procedure: Primary CESAREAN SECTION;  Surgeon: Maxie Better, MD;  Location: [facility] LD ORS;  Service: Obstetrics;  Laterality: N/A;  EDD: 10/19/18   MYOMECTOMY     VAGINAL HYSTERECTOMY  2022    Prior to Admission medications   Medication Sig Start Date End Date Taking? Authorizing Provider  atorvastatin (LIPITOR) 20 MG tablet TAKE 1 TABLET(20 MG) BY MOUTH DAILY 10/15/22  Yes Philip Aspen, Limmie Patricia, MD  LORazepam (ATIVAN) 0.5 MG tablet Take 1 tablet (0.5 mg total) by mouth daily as needed for anxiety (For anxiety with flying). 09/03/22  Yes Philip Aspen, Limmie Patricia, MD  pantoprazole (PROTONIX) 40 MG tablet TAKE 1 TABLET(40 MG) BY MOUTH DAILY BEFORE BREAKFAST 12/19/22  Yes Philip Aspen, Limmie Patricia, MD  sertraline (ZOLOFT) 50 MG tablet Take 1 tablet (50 mg total) by mouth daily. 11/04/22  Yes Langston Reusing, MD  sucralfate (CARAFATE) 1 g tablet Take 1 g by mouth at bedtime. 10/02/22  Yes [provider]   Magnesium Glycinate 120 MG CAPS Take 240 mg by mouth.    [provider]  Multiple Vitamin (MULTIVITAMIN ADULT PO) Take by mouth.    [provider]  Nutritional Supplements (NUTRITIONAL SUPPLEMENT PO) Take by mouth. Orgain collagen peptide    [provider]    Current Outpatient Medications  Medication Sig Dispense Refill   atorvastatin (LIPITOR) 20 MG tablet TAKE 1 TABLET(20 MG) BY MOUTH DAILY 90 tablet 1   LORazepam (ATIVAN) 0.5 MG tablet Take 1 tablet (0.5 mg total) by mouth daily as needed for anxiety (For anxiety with flying). 10 tablet 0   pantoprazole (PROTONIX) 40 MG tablet TAKE 1 TABLET(40 MG) BY MOUTH DAILY BEFORE BREAKFAST 90 tablet 0   sertraline (ZOLOFT) 50 MG tablet Take 1 tablet (50 mg total) by mouth daily. 90 tablet 0   sucralfate (CARAFATE) 1 g tablet Take 1 g by mouth at bedtime.     Magnesium Glycinate 120 MG CAPS Take 240 mg by mouth.     Multiple Vitamin (MULTIVITAMIN ADULT PO) Take by mouth.     Nutritional Supplements (NUTRITIONAL SUPPLEMENT PO) Take by mouth. Orgain collagen peptide     Current Facility-Administered Medications  Medication Dose Route Frequency Provider Last Rate Last Admin   0.9 %  sodium chloride infusion  500 mL Intravenous Once Kolbie Clarkston, Eleonore Chiquito, MD  Allergies as of 12/30/2022 - Review Complete 12/30/2022  Allergen Reaction Noted   Cinnamon  09/28/2018    Family History  Problem Relation Age of Onset   Stroke Mother    Anxiety disorder Mother    Anxiety disorder Father    Heart disease Father    Kidney disease Father    Alcoholism Father    Colon cancer Neg Hx    Stomach cancer Neg Hx    Esophageal cancer Neg Hx     Social History   Socioeconomic History   Marital status: Married    Spouse name: Not on file   Number of children: Not on file   Years of education: Not on file   Highest education level: Not on file  Occupational History   Occupation: Acupuncturist  Tobacco Use    Smoking status: Never   Smokeless tobacco: Never  Vaping Use   Vaping status: Never Used  Substance and Sexual Activity   Alcohol use: Not Currently    Comment: rare   Drug use: Never   Sexual activity: Not on file  Other Topics Concern   Not on file  Social History Narrative   Not on file   Social Determinants of Health   Financial Resource Strain: Low Risk  (09/17/2018)   Overall Financial Resource Strain (CARDIA)    Difficulty of Paying Living Expenses: Not hard at all  Food Insecurity: No Food Insecurity (09/17/2018)   Hunger Vital Sign    Worried About Running Out of Food in the Last Year: Never true    Ran Out of Food in the Last Year: Never true  Transportation Needs: Unknown (09/17/2018)   PRAPARE - Administrator, Civil Service (Medical): No    Lack of Transportation (Non-Medical): Not on file  Physical Activity: Not on file  Stress: Stress Concern Present (09/17/2018)   [employer] of Occupational Health - Occupational Stress Questionnaire    Feeling of Stress : To some extent  Social Connections: Not on file  Intimate Partner Violence: Not At Risk (09/17/2018)   Humiliation, Afraid, Rape, and Kick questionnaire    Fear of Current or Ex-Partner: No    Emotionally Abused: No    Physically Abused: No    Sexually Abused: No    Review of Systems:  All other review of systems negative except as mentioned in the HPI.  Physical Exam: Vital signs in last 24 hours: BP 114/63   Pulse 65   Temp 98 F (36.7 C) (Temporal)   Ht 5\' 2"  (1.575 m)   Wt 209 lb (94.8 kg)   LMP 12/19/2017 (Approximate)   SpO2 98%   BMI 38.23 kg/m  General:   Alert, NAD Lungs:  Clear .   Heart:  Regular rate and rhythm Abdomen:  Soft, nontender and nondistended. Neuro/Psych:  Alert and cooperative. Normal mood and affect. A and O x 3  Reviewed labs, radiology imaging, old records and pertinent past GI work up  Patient is appropriate for planned procedure(s) and anesthesia in  an ambulatory setting   K. Scherry Ran , MD [phone]

## 2022-12-30 NOTE — Progress Notes (Signed)
1033 Robinul 0.1 mg IV given due large amount of secretions upon assessment.  MD made aware, vss

## 2022-12-31 ENCOUNTER — Telehealth: Payer: Self-pay | Admitting: *Deleted

## 2022-12-31 NOTE — Telephone Encounter (Signed)
Per colonoscopy report patient is to be referred for history of gallstones consult cholecystectomy  Largest gallstone 1.4 cm diameter

## 2022-12-31 NOTE — Telephone Encounter (Signed)
  Follow up Call-     12/30/2022    9:22 AM  Call back number  Post procedure Call Back phone  # [phone]  Permission to leave phone message Yes     Patient questions:  Do you have a fever, pain , or abdominal swelling? No. Pain Score  0 *  Have you tolerated food without any problems? Yes.    Have you been able to return to your normal activities? Yes.    Do you have any questions about your discharge instructions: Diet   No. Medications  No. Follow up visit  No.  Do you have questions or concerns about your Care? No.  Actions: * If pain score is 4 or above: No action needed, pain <4.

## 2023-01-07 NOTE — Telephone Encounter (Signed)
Faxed referral again today, realized it didn't go through our fax machine the first time  Waiting on response

## 2023-01-08 ENCOUNTER — Ambulatory Visit (INDEPENDENT_AMBULATORY_CARE_PROVIDER_SITE_OTHER): Payer: Commercial Managed Care - PPO | Admitting: Family Medicine

## 2023-01-08 ENCOUNTER — Encounter (INDEPENDENT_AMBULATORY_CARE_PROVIDER_SITE_OTHER): Payer: Self-pay | Admitting: Family Medicine

## 2023-01-08 VITALS — BP 104/58 | HR 64 | Temp 97.8°F | Ht 62.0 in | Wt 198.0 lb

## 2023-01-08 DIAGNOSIS — E7849 Other hyperlipidemia: Secondary | ICD-10-CM | POA: Diagnosis not present

## 2023-01-08 DIAGNOSIS — R7303 Prediabetes: Secondary | ICD-10-CM | POA: Diagnosis not present

## 2023-01-08 DIAGNOSIS — Z6836 Body mass index (BMI) 36.0-36.9, adult: Secondary | ICD-10-CM

## 2023-01-08 DIAGNOSIS — E669 Obesity, unspecified: Secondary | ICD-10-CM

## 2023-01-08 NOTE — Progress Notes (Signed)
Chief Complaint:   OBESITY Cheyenne Keller is here to discuss her progress with her obesity treatment plan along with follow-up of her obesity related diagnoses. Cheyenne Keller is on the Category 2 Plan and states she is following her eating plan approximately 85% of the time. Cheyenne Keller states she is doing 0 minutes 0 times per week.  Today's visit was #: 6 Starting weight: 218 lbs Starting date: 09/30/2022 Today's weight: 198 lbs Today's date: 01/08/2023 Total lbs lost to date: 20 Total lbs lost since last in-office visit: 4  Interim History: Patient has had a difficult few weeks trying to get back.  She has been baking cakes and wanting to eat the cake.  She wants to start exercising but is having a hard time getting started.   Subjective:   1. Prediabetes Patient's last A1c was 6.2 and 6.3. She is not on medications.   2. Other hyperlipidemia Patient is on Lipitor daily. She denies myalgias or transaminitis.   Assessment/Plan:   1. Prediabetes We will repeat labs in December.   2. Other hyperlipidemia Patient will continue Lipitor with no change in dose.   3. BMI 36.0-36.9,adult  4. Obesity with starting BMI of 40.0 Cheyenne Keller is currently in the action stage of change. As such, her goal is to continue with weight loss efforts. She has agreed to the Category 2 Plan.   Exercise goals: All adults should avoid inactivity. Some physical activity is better than none, and adults who participate in any amount of physical activity gain some health benefits.  Behavioral modification strategies: increasing lean protein intake, meal planning and cooking strategies, keeping healthy foods in the home, and planning for success.  Cheyenne Keller has agreed to follow-up with our clinic in 4 weeks. She was informed of the importance of frequent follow-up visits to maximize her success with intensive lifestyle modifications for her multiple health conditions.   Objective:   Blood pressure (!) 104/58,  pulse 64, temperature 97.8 F (36.6 C), height 5\' 2"  (1.575 m), weight 198 lb (89.8 kg), last menstrual period 12/19/2017, SpO2 100%. Body mass index is 36.21 kg/m.  General: Cooperative, alert, well developed, in no acute distress. HEENT: Conjunctivae and lids unremarkable. Cardiovascular: Regular rhythm.  Lungs: Normal work of breathing. Neurologic: No focal deficits.   Lab Results  Component Value Date   CREATININE 0.78 09/30/2022   BUN 11 09/30/2022   NA 137 09/30/2022   K 4.7 09/30/2022   CL 100 09/30/2022   CO2 24 09/30/2022   Lab Results  Component Value Date   ALT 27 09/30/2022   AST 19 09/30/2022   ALKPHOS 105 09/30/2022   BILITOT 0.6 09/30/2022   Lab Results  Component Value Date   HGBA1C 6.2 (H) 10/02/2022   HGBA1C 6.1 10/10/2021   Lab Results  Component Value Date   INSULIN 6.3 09/30/2022   Lab Results  Component Value Date   TSH 1.670 09/30/2022   Lab Results  Component Value Date   CHOL 153 09/30/2022   HDL 61 09/30/2022   LDLCALC 77 09/30/2022   TRIG 77 09/30/2022   CHOLHDL 4 04/18/2022   Lab Results  Component Value Date   VD25OH 44.5 09/30/2022   VD25OH 45.44 01/13/2022   VD25OH 23.10 (L) 10/10/2021   Lab Results  Component Value Date   WBC 7.0 10/02/2022   HGB 11.0 (L) 10/02/2022   HCT 34.1 10/02/2022   MCV 88 10/02/2022   PLT 193 10/02/2022   Lab Results  Component Value Date  IRON 60 10/02/2022   TIBC 272 10/02/2022   FERRITIN 266 (H) 10/02/2022   Attestation Statements:   Reviewed by clinician on day of visit: allergies, medications, problem list, medical history, surgical history, family history, social history, and previous encounter notes.  Time spent on visit including pre-visit chart review and post-visit care and charting was 30 minutes.   I, Burt Knack, am acting as transcriptionist for Reuben Likes, MD.  I have reviewed the above documentation for accuracy and completeness, and I agree with the above. -  Reuben Likes, MD

## 2023-01-14 NOTE — Telephone Encounter (Signed)
I called [facility] and patient is scheduled on 01/16/2023 with Dr Ivar Drape

## 2023-01-19 ENCOUNTER — Encounter: Payer: Self-pay | Admitting: Internal Medicine

## 2023-01-19 ENCOUNTER — Ambulatory Visit: Payer: Commercial Managed Care - PPO | Admitting: Internal Medicine

## 2023-01-19 VITALS — BP 102/68 | HR 76 | Temp 98.2°F | Wt 200.2 lb

## 2023-01-19 DIAGNOSIS — M65331 Trigger finger, right middle finger: Secondary | ICD-10-CM

## 2023-01-19 MED ORDER — MELOXICAM 7.5 MG PO TABS
7.5000 mg | ORAL_TABLET | Freq: Every day | ORAL | 0 refills | Status: DC
Start: 2023-01-19 — End: 2023-04-02

## 2023-01-19 NOTE — Progress Notes (Signed)
Established Patient Office Visit     CC/Reason for Visit: Right middle trigger finger  HPI: Cheyenne Keller is a 42 y.o. female who is coming in today for the above mentioned reasons.  This only started occurring in the last 24 hours.  She has had pain and sensation of her right middle finger getting locked when pushed into a down position.   Past Medical/Surgical History: Past Medical History:  Diagnosis Date   Anxiety    Arthritis    Asthma, exercise induced    Back pain    Fibroid    Gall stones    High cholesterol    Iron deficiency anemia    Lactose intolerance    PCOS (polycystic ovarian syndrome)    Pelvic adhesions    SOB (shortness of breath)    Stomach ulcer    Vitamin D deficiency     Past Surgical History:  Procedure Laterality Date   CESAREAN SECTION N/A 09/28/2018   Procedure: Primary CESAREAN SECTION;  Surgeon: Maxie Better, MD;  Location: [facility] LD ORS;  Service: Obstetrics;  Laterality: N/A;  EDD: 10/19/18   MYOMECTOMY     VAGINAL HYSTERECTOMY  2022    Social History:  reports that she has never smoked. She has never used smokeless tobacco. She reports that she does not currently use alcohol. She reports that she does not use drugs.  Allergies: No Active Allergies  Family History:  Family History  Problem Relation Age of Onset   Stroke Mother    Anxiety disorder Mother    Anxiety disorder Father    Heart disease Father    Kidney disease Father    Alcoholism Father    Colon cancer Neg Hx    Stomach cancer Neg Hx    Esophageal cancer Neg Hx      Current Outpatient Medications:    atorvastatin (LIPITOR) 20 MG tablet, TAKE 1 TABLET(20 MG) BY MOUTH DAILY, Disp: 90 tablet, Rfl: 1   Magnesium Glycinate 120 MG CAPS, Take 240 mg by mouth., Disp: , Rfl:    meloxicam (MOBIC) 7.5 MG tablet, Take 1 tablet (7.5 mg total) by mouth daily., Disp: 30 tablet, Rfl: 0   Multiple Vitamin (MULTIVITAMIN ADULT PO), Take by mouth., Disp: , Rfl:     sertraline (ZOLOFT) 50 MG tablet, Take 1 tablet (50 mg total) by mouth daily., Disp: 90 tablet, Rfl: 0  Review of Systems:  Negative unless indicated in HPI.   Physical Exam: Vitals:   01/19/23 1328  BP: 102/68  Pulse: 76  Temp: 98.2 F (36.8 C)  TempSrc: Oral  SpO2: 100%  Weight: 200 lb 3.2 oz (90.8 kg)    Body mass index is 36.62 kg/m.   Physical Exam Vitals reviewed.  Constitutional:      Appearance: Normal appearance.  HENT:     Head: Normocephalic and atraumatic.  Eyes:     Conjunctiva/sclera: Conjunctivae normal.     Pupils: Pupils are equal, round, and reactive to light.  Skin:    General: Skin is warm and dry.  Neurological:     General: No focal deficit present.     Mental Status: She is alert and oriented to person, place, and time.  Psychiatric:        Mood and Affect: Mood normal.        Behavior: Behavior normal.        Thought Content: Thought content normal.        Judgment: Judgment normal.  Impression and Plan:  Trigger finger, right middle finger -     Meloxicam; Take 1 tablet (7.5 mg total) by mouth daily.  Dispense: 30 tablet; Refill: 0  -She will try meloxicam daily for 10 days, if no improvement consider referral to sports medicine or hand surgeon.   Time spent:21 minutes reviewing chart, interviewing and examining patient and formulating plan of care.     Chaya Jan, MD [facility]

## 2023-01-27 ENCOUNTER — Ambulatory Visit (INDEPENDENT_AMBULATORY_CARE_PROVIDER_SITE_OTHER): Payer: Commercial Managed Care - PPO | Admitting: Family Medicine

## 2023-01-27 ENCOUNTER — Encounter (INDEPENDENT_AMBULATORY_CARE_PROVIDER_SITE_OTHER): Payer: Self-pay | Admitting: Family Medicine

## 2023-01-27 DIAGNOSIS — F419 Anxiety disorder, unspecified: Secondary | ICD-10-CM | POA: Diagnosis not present

## 2023-01-27 DIAGNOSIS — E559 Vitamin D deficiency, unspecified: Secondary | ICD-10-CM | POA: Diagnosis not present

## 2023-01-27 DIAGNOSIS — E669 Obesity, unspecified: Secondary | ICD-10-CM | POA: Diagnosis not present

## 2023-01-27 DIAGNOSIS — Z6836 Body mass index (BMI) 36.0-36.9, adult: Secondary | ICD-10-CM

## 2023-01-27 NOTE — Assessment & Plan Note (Addendum)
Previously on sertraline but stopped due to insomnia and dry mouth and dry throat.  Symptoms are better even after stopping it.  She is taking magnesium supplement now. Medication list updated- sertraline removed.

## 2023-01-27 NOTE — Progress Notes (Signed)
   SUBJECTIVE:  Chief Complaint: Obesity  Interim History: Patient is starting to feel ill today. Since last appointment she saw Dr. Ardyth Harps and started meloxicam for what could be trigger finger.  She stopped the sertraline due to insomnia. She is trying to stay focused on plan as much as she can.  She recognizes she hasn't been as fastidious on her plan as she was previously but also isn't eating out of control.  She is making more conscious choices.  She isn't eating as much protein as she needs to.  She is planning to cook for Thanksgiving- her in laws are coming over.  For December she is staying home and cooking.    Cheyenne Keller is here to discuss her progress with her obesity treatment plan. She is on the Category 2 Plan and states she is following her eating plan approximately 75 % of the time. She states she is dong some walking.   OBJECTIVE: Visit Diagnoses: Problem List Items Addressed This Visit       Other   Vitamin D deficiency    Discussed importance of vitamin d supplementation.  Vitamin d supplementation has been shown to decrease fatigue, decrease risk of progression to insulin resistance and then prediabetes, decreases risk of falling in older age and can even assist in decreasing depressive symptoms in PTSD.   She previously took vitamin d but needs to get capsules as the tablets were causing some GI symptoms.[facility] outpatient pharmacy list given to patient.       Anxiety    Previously on sertraline but stopped due to insomnia and dry mouth and dry throat.  Symptoms are better even after stopping it.  She is taking magnesium supplement now. Medication list updated- sertraline removed.       Other Visit Diagnoses     Obesity with starting BMI of 40.0    -  Primary   BMI 36.0-36.9,adult           Vitals Temp: 98.1 F (36.7 C) BP: 94/60 Pulse Rate: 69 SpO2: 99 %   Anthropometric Measurements Height: 5\' 2"  (1.575 m) Weight: 199 lb (90.3 kg) BMI (Calculated):  36.39 Weight at Last Visit: 198 lb Weight Lost Since Last Visit: 0 Weight Gained Since Last Visit: 1 Starting Weight: 218 lb Total Weight Loss (lbs): 19 lb (8.618 kg)   Body Composition  Body Fat %: 44.9 % Fat Mass (lbs): 89.6 lbs Muscle Mass (lbs): 104.2 lbs Total Body Water (lbs): 77.8 lbs Visceral Fat Rating : 11   Other Clinical Data Today's Visit #: 7 Starting Date: 09/30/22     ASSESSMENT AND PLAN:  Diet: Pierre is currently in the action stage of change. As such, her goal is to continue with weight loss efforts. She has agreed to Category 2 Plan.  Exercise: Tykeria has been instructed that some exercise is better than none for weight loss and overall health benefits.   Behavior Modification:  We discussed the following Behavioral Modification Strategies today: increasing lean protein intake, increasing vegetables, holiday eating strategies, and celebration eating strategies.  No follow-ups on file.Marland Kitchen She was informed of the importance of frequent follow up visits to maximize her success with intensive lifestyle modifications for her multiple health conditions.  Attestation Statements:   Reviewed by clinician on day of visit: allergies, medications, problem list, medical history, surgical history, family history, social history, and previous encounter notes.   Rudean Hitt, MD

## 2023-01-27 NOTE — Assessment & Plan Note (Signed)
Discussed importance of vitamin d supplementation.  Vitamin d supplementation has been shown to decrease fatigue, decrease risk of progression to insulin resistance and then prediabetes, decreases risk of falling in older age and can even assist in decreasing depressive symptoms in PTSD.   She previously took vitamin d but needs to get capsules as the tablets were causing some GI symptoms.[facility] outpatient pharmacy list given to patient.

## 2023-02-10 ENCOUNTER — Ambulatory Visit (INDEPENDENT_AMBULATORY_CARE_PROVIDER_SITE_OTHER): Payer: Commercial Managed Care - PPO | Admitting: Family Medicine

## 2023-02-17 ENCOUNTER — Encounter: Payer: Self-pay | Admitting: Internal Medicine

## 2023-02-17 DIAGNOSIS — M65331 Trigger finger, right middle finger: Secondary | ICD-10-CM

## 2023-02-24 ENCOUNTER — Ambulatory Visit (INDEPENDENT_AMBULATORY_CARE_PROVIDER_SITE_OTHER): Payer: Commercial Managed Care - PPO | Admitting: Family Medicine

## 2023-02-24 ENCOUNTER — Encounter (INDEPENDENT_AMBULATORY_CARE_PROVIDER_SITE_OTHER): Payer: Self-pay | Admitting: Family Medicine

## 2023-02-24 VITALS — BP 106/66 | HR 72 | Temp 98.0°F | Ht 62.0 in | Wt 203.0 lb

## 2023-02-24 DIAGNOSIS — E669 Obesity, unspecified: Secondary | ICD-10-CM

## 2023-02-24 DIAGNOSIS — R7303 Prediabetes: Secondary | ICD-10-CM | POA: Diagnosis not present

## 2023-02-24 DIAGNOSIS — E7849 Other hyperlipidemia: Secondary | ICD-10-CM

## 2023-02-24 DIAGNOSIS — Z6837 Body mass index (BMI) 37.0-37.9, adult: Secondary | ICD-10-CM

## 2023-02-24 DIAGNOSIS — E785 Hyperlipidemia, unspecified: Secondary | ICD-10-CM

## 2023-02-24 NOTE — Progress Notes (Signed)
   SUBJECTIVE:  Chief Complaint: Obesity  Interim History: Since last appointment patient voices she has been eating and drinking more.  She is feeling like she is making excuse around the holiday and over indulging. She does celebrate Christmas and is wondering how to be in control of her intake during the holiday.  She is likely hosting Christmas as she doesn't like going anywhere.   Cheyenne Keller is here to discuss her progress with her obesity treatment plan. She is on the Category 2 Plan and states she is following her eating plan approximately 65-70 % of the time. She states she is not exercising.   OBJECTIVE: Visit Diagnoses: Problem List Items Addressed This Visit       Other   Hyperlipidemia   The 10-year ASCVD risk score (Arnett DK, et al., 2019) is: 0.2%   Values used to calculate the score:     Age: 42 years     Sex: Female     Is Non-Hispanic African American: Yes     Diabetic: No     Tobacco smoker: No     Systolic Blood Pressure: 106 mmHg     Is BP treated: No     HDL Cholesterol: 61 mg/dL     Total Cholesterol: 153 mg/dL Not high enough to warrant medication.  Discussed importance of limiting Saturated fat and as long patient is following meal plan that will control intake.      Prediabetes - Primary   Patient has been a bit more overindulgent than previously.  Last A1c of 6.2 in July.  Needs to have repeat labs with PCP or me in the next 2-3 months. Discussed that we anticipate higher carb intake over hte holidays but the importance of going back to a more controlled intake after the holidays.      Other Visit Diagnoses       BMI 37.0-37.9, adult         Obesity with starting BMI of 40.0           No data recorded  No data recorded  No data recorded  No data recorded    ASSESSMENT AND PLAN:  Diet: Cheyenne Keller is currently in the action stage of change. As such, her goal is to continue with weight loss efforts. She has agreed to Category 2  Plan.  Exercise: Cheyenne Keller has been instructed that some exercise is better than none for weight loss and overall health benefits.   Behavior Modification:  We discussed the following Behavioral Modification Strategies today: increasing lean protein intake, increasing vegetables, meal planning and cooking strategies, holiday eating strategies, and planning for success.   Return in about 5 weeks (around 03/31/2023) for fasting labs and IC.Marland Kitchen She was informed of the importance of frequent follow up visits to maximize her success with intensive lifestyle modifications for her multiple health conditions.  Attestation Statements:   Reviewed by clinician on day of visit: allergies, medications, problem list, medical history, surgical history, family history, social history, and previous encounter notes.  Total time spent in pre visit chart review, face to face patient interaction and post visit charting was 30 minutes.  Reuben Likes, MD

## 2023-03-06 DIAGNOSIS — R7303 Prediabetes: Secondary | ICD-10-CM | POA: Insufficient documentation

## 2023-03-06 NOTE — Assessment & Plan Note (Signed)
Patient has been a bit more overindulgent than previously.  Last A1c of 6.2 in July.  Needs to have repeat labs with PCP or me in the next 2-3 months. Discussed that we anticipate higher carb intake over hte holidays but the importance of going back to a more controlled intake after the holidays.

## 2023-03-06 NOTE — Assessment & Plan Note (Signed)
The 10-year ASCVD risk score (Arnett DK, et al., 2019) is: 0.2%   Values used to calculate the score:     Age: 42 years     Sex: Female     Is Non-Hispanic African American: Yes     Diabetic: No     Tobacco smoker: No     Systolic Blood Pressure: 106 mmHg     Is BP treated: No     HDL Cholesterol: 61 mg/dL     Total Cholesterol: 153 mg/dL Not high enough to warrant medication.  Discussed importance of limiting Saturated fat and as long patient is following meal plan that will control intake.

## 2023-03-31 ENCOUNTER — Telehealth (INDEPENDENT_AMBULATORY_CARE_PROVIDER_SITE_OTHER): Payer: Self-pay | Admitting: Family Medicine

## 2023-03-31 NOTE — Telephone Encounter (Signed)
 PT WANTS TO KEEP APPT BUT CAHNGE BLOOD WORK TO ANOTHER DAY AS SHE HAS NOT TAKEN MEDS AS SPECIFIED

## 2023-04-02 ENCOUNTER — Ambulatory Visit (INDEPENDENT_AMBULATORY_CARE_PROVIDER_SITE_OTHER): Payer: Commercial Managed Care - PPO | Admitting: Family Medicine

## 2023-04-02 ENCOUNTER — Encounter (INDEPENDENT_AMBULATORY_CARE_PROVIDER_SITE_OTHER): Payer: Self-pay | Admitting: Family Medicine

## 2023-04-02 VITALS — BP 109/65 | HR 72 | Temp 98.1°F | Ht 62.0 in | Wt 205.0 lb

## 2023-04-02 DIAGNOSIS — E7849 Other hyperlipidemia: Secondary | ICD-10-CM

## 2023-04-02 DIAGNOSIS — E559 Vitamin D deficiency, unspecified: Secondary | ICD-10-CM

## 2023-04-02 DIAGNOSIS — E785 Hyperlipidemia, unspecified: Secondary | ICD-10-CM | POA: Diagnosis not present

## 2023-04-02 DIAGNOSIS — D509 Iron deficiency anemia, unspecified: Secondary | ICD-10-CM | POA: Diagnosis not present

## 2023-04-02 DIAGNOSIS — R7303 Prediabetes: Secondary | ICD-10-CM

## 2023-04-02 DIAGNOSIS — D508 Other iron deficiency anemias: Secondary | ICD-10-CM

## 2023-04-02 DIAGNOSIS — E669 Obesity, unspecified: Secondary | ICD-10-CM

## 2023-04-02 DIAGNOSIS — Z6837 Body mass index (BMI) 37.0-37.9, adult: Secondary | ICD-10-CM

## 2023-04-02 NOTE — Progress Notes (Unsigned)
   SUBJECTIVE:  Chief Complaint: Obesity  Interim History: Patient voices that she did eat off plan during the holiday season.  She did try to be mindful of quantity of indulgences and stay in control of the food she was taking in.  She tried to get rid of the indulgent food so it would be gone by the time of her surgery.   Cheyenne Keller is here to discuss her progress with her obesity treatment plan. She is on the Category 2 Plan and states she is following her eating plan approximately 25 % of the time. She states she is not exercising.   OBJECTIVE: Visit Diagnoses: Problem List Items Addressed This Visit       Other   Iron deficiency anemia   Relevant Orders   CBC w/Diff/Platelet   Anemia panel   Hyperlipidemia   Relevant Orders   Lipid Panel With LDL/HDL Ratio   Vitamin D deficiency   Relevant Orders   VITAMIN D 25 Hydroxy (Vit-D Deficiency, Fractures)   Prediabetes - Primary   Relevant Orders   Hemoglobin A1c   Insulin, random   Comprehensive metabolic panel    No data recorded No data recorded No data recorded No data recorded   ASSESSMENT AND PLAN:  Diet: Cheyenne Keller is currently in the action stage of change. As such, her goal is to continue with weight loss efforts. She has agreed to practicing portion control and making smarter food choices, such as increasing vegetables and decreasing simple carbohydrates.  She will slowly reintroduce food back in after surgery as tolerated.  Exercise: Cheyenne Keller has been instructed that some exercise is better than none for weight loss and overall health benefits.   Behavior Modification:  We discussed the following Behavioral Modification Strategies today: increasing lean protein intake, increasing vegetables, meal planning and cooking strategies, better snacking choices, and planning for success.    No follow-ups on file.Marland Kitchen She was informed of the importance of frequent follow up visits to maximize her success with intensive  lifestyle modifications for her multiple health conditions.  Attestation Statements:   Reviewed by clinician on day of visit: allergies, medications, problem list, medical history, surgical history, family history, social history, and previous encounter notes.     Reuben Likes, MD

## 2023-04-06 NOTE — Assessment & Plan Note (Signed)
Has been taking a daily multivitamin for better assistance in her iron deficiency anemia as well as increasing iron rich foods.  A repeat complete blood count and anemia panel were ordered today.  We will get these labs done after patient's surgery but prior to her next appointment to discuss results at next appointment.

## 2023-04-06 NOTE — Assessment & Plan Note (Signed)
Last vitamin D level was 44 in July 2024.  Patient has been on supplemental vitamin D since that time.  Her energy levels have only minimally improved.  Will repeat her vitamin D and lab order was placed today.  She will get her labs done at earliest convenience prior to next appointment.

## 2023-04-06 NOTE — Assessment & Plan Note (Signed)
Starting A1c of 6.2.  Patient has continued to be mindful of food intake and macronutrient composition of that food intake since that time.  She does still struggle with cravings and desires to eat.  Particularly in the last few weeks over the holidays patient reports being slightly more indulging in her eating.  Given that we discussed likely A1c values and that we may not see the decrease we had anticipated as we are doing labs shortly after the holiday season.  That being said do not anticipate worsening of an A1c given her previous mindful eating.  We will discuss labs after patient gets them drawn at her next appointment.

## 2023-04-06 NOTE — Assessment & Plan Note (Signed)
Patient has a history of iron deficiency anemia and is currently on a daily multivitamin.  Repeat complete blood count with differential and anemia panel was ordered today.  Patient will be getting these labs after her surgery prior to next appointment.

## 2023-04-06 NOTE — Assessment & Plan Note (Signed)
Last LDL 11 months ago was 141.  Patient needs a repeat fasting cholesterol panel which was ordered today.  She would like to get these labs done after her surgery.  We will draw all the labs prior to next appointment and discuss results at that time.

## 2023-04-30 ENCOUNTER — Encounter (INDEPENDENT_AMBULATORY_CARE_PROVIDER_SITE_OTHER): Payer: Self-pay | Admitting: Physician Assistant

## 2023-04-30 ENCOUNTER — Ambulatory Visit (INDEPENDENT_AMBULATORY_CARE_PROVIDER_SITE_OTHER): Payer: Commercial Managed Care - PPO | Admitting: Physician Assistant

## 2023-04-30 ENCOUNTER — Ambulatory Visit (INDEPENDENT_AMBULATORY_CARE_PROVIDER_SITE_OTHER): Payer: Commercial Managed Care - PPO | Admitting: Family Medicine

## 2023-04-30 VITALS — BP 114/71 | HR 67 | Temp 98.7°F | Ht 62.0 in | Wt 205.0 lb

## 2023-04-30 DIAGNOSIS — R7303 Prediabetes: Secondary | ICD-10-CM

## 2023-04-30 DIAGNOSIS — E7849 Other hyperlipidemia: Secondary | ICD-10-CM

## 2023-04-30 DIAGNOSIS — Z6837 Body mass index (BMI) 37.0-37.9, adult: Secondary | ICD-10-CM

## 2023-04-30 DIAGNOSIS — D509 Iron deficiency anemia, unspecified: Secondary | ICD-10-CM | POA: Diagnosis not present

## 2023-04-30 DIAGNOSIS — E559 Vitamin D deficiency, unspecified: Secondary | ICD-10-CM

## 2023-04-30 DIAGNOSIS — E785 Hyperlipidemia, unspecified: Secondary | ICD-10-CM

## 2023-04-30 DIAGNOSIS — D508 Other iron deficiency anemias: Secondary | ICD-10-CM

## 2023-04-30 NOTE — Progress Notes (Signed)
SUBJECTIVE: Discussed the use of AI scribe software for clinical note transcription with the patient, who gave verbal consent to proceed.  Chief Complaint: Obesity  Interim History: She maintained her weight since last visit.  S/P Cholecystectomy in Jan. 2025  Cheyenne Keller is a 43 year old female with obesity who presents for follow-up of her obesity treatment plan.  She recently underwent laparoscopic cholecystectomy at the beginning of the year and has resumed exercising about two weeks ago. The surgery was less painful than anticipated.  She is focusing on her diet but feels she is not consuming enough protein and is eating more carbohydrates than recommended.  Her current diet includes small portions of carbohydrate-rich foods such as rice, beans, and fried plantains, despite knowing fried foods are not recommended. She feels fine after consuming these foods but experiences hunger. She is considering incorporating more fish into her diet and exploring pescatarian meal options.  She has a history of prediabetes, vitamin D deficiency, hyperlipidemia, and iron deficiency anemia. She is currently taking Lipitor 20 mg daily for hyperlipidemia, along with a multivitamin and magnesium glycinate.  She does not particularly enjoy eating a lot of meat and prefers savory foods over sweet ones, sometimes opting for tofu, bok choy, and broth as part of her meals.  She has two young children, aged four and two, which makes meal planning and preparation challenging. No stomach upset has been experienced following her gallbladder surgery, and she tries to avoid consuming too much fat.  Cheyenne Keller is here to discuss her progress with her obesity treatment plan. She is on the Category 2 Plan and states she is following her eating plan approximately 65 % of the time. She states she is exercising lifting weights 30 minutes 3-4 times per week.   OBJECTIVE: Visit Diagnoses: Problem List Items  Addressed This Visit     Iron deficiency anemia   Hyperlipidemia   Vitamin D deficiency   Prediabetes - Primary   Other Visit Diagnoses       Obesity with starting BMI of 40.0         Obesity Cheyenne Keller  42, resumed exercise two weeks post-laparoscopic cholecystectomy. She struggles with diet adherence, particularly protein intake and avoiding high-carb and fried foods. Emphasized balanced nutrition and 80-90% diet adherence for steady weight loss.  Recommended pescatarian diet and meal planning/prepping. - Encourage balanced diet with adequate protein - Recommend pescatarian diet plan - Suggest meal planning and prepping - Set short-term goal to follow diet plan strictly for next two weeks  Prediabetes Maintaining a balanced diet and regular exercise is crucial to prevent progression to diabetes. Lab Results  Component Value Date   HGBA1C 6.2 (H) 10/02/2022   HGBA1C 6.1 10/10/2021   Lab Results  Component Value Date   LDLCALC 77 09/30/2022   CREATININE 0.78 09/30/2022  Continue working on nutrition plan to decrease simple carbohydrates, increase lean proteins and exercise to promote weight loss, improve glycemic control and prevent progression to Type 2 diabetes.    Hyperlipidemia Cheyenne Keller is on Lipitor 20 mg daily.  No side effects. Continued adherence to medication and dietary modifications are essential. Discussed the importance of regular lipid level monitoring. - Continue Lipitor 20 mg daily Continue to work on nutrition plan -decreasing simple carbohydrates, increasing lean proteins, decreasing saturated fats and cholesterol , avoiding trans fats and exercise as able to promote weight loss, improve lipids and decrease cardiovascular risks.   Vitamin D Deficiency Cheyenne Keller is currently taking a multivitamin.  Discussed the need for regular monitoring of vitamin D levels. - Continue multivitamin supplementation - Monitor vitamin D levels to optimize supplementation. Low  vitamin D levels can be associated with adiposity and may result in leptin resistance and weight gain. Also associated with fatigue.  Currently on vitamin D supplementation without any adverse effects such as nausea, vomiting or muscle weakness.    Iron Deficiency Anemia Emphasized the importance of regular monitoring of hemoglobin and iron levels. - Monitor hemoglobin and iron levels regularly  General Health Maintenance None - Follow-up with Dr. Lawson Radar in four weeks - Order labs during the next visit per patient preference.   Follow-up - Schedule labs for the next visit.  Vitals Temp: 98.7 F (37.1 C) BP: 114/71 Pulse Rate: 67 SpO2: 100 %   Anthropometric Measurements Height: 5\' 2"  (1.575 m) Weight: 205 lb (93 kg) BMI (Calculated): 37.49 Weight at Last Visit: 205lb Weight Lost Since Last Visit: 0lb Weight Gained Since Last Visit: 0lb Starting Weight: 218lb Total Weight Loss (lbs): 13 lb (5.897 kg)   Body Composition  Body Fat %: 45.7 % Fat Mass (lbs): 94 lbs Muscle Mass (lbs): 106 lbs Total Body Water (lbs): 79.4 lbs Visceral Fat Rating : 12   Other Clinical Data Fasting: yes Today's Visit #: 10 Starting Date: 09/30/22     ASSESSMENT AND PLAN:  Diet: Cheyenne Keller is currently in the action stage of change. As such, her goal is to continue with weight loss efforts. She has agreed to Category 2 Plan and Pescatarian Plan.  Exercise: Cheyenne Keller has been instructed to work up to a goal of 150 minutes of combined cardio and strengthening exercise per week for weight loss and overall health benefits.   Behavior Modification:  We discussed the following Behavioral Modification Strategies today: increasing lean protein intake, decreasing simple carbohydrates, increasing vegetables, increase H2O intake, increase high fiber foods, no skipping meals, meal planning and cooking strategies, avoiding temptations, and planning for success. We discussed various medication  options to help Cheyenne Keller with her weight loss efforts and we both agreed to continue to work on nutritional and behavioral strategies to promote weight loss.  .  Return in about 4 weeks (around 05/28/2023) for Fasting Lab.Marland Kitchen She was informed of the importance of frequent follow up visits to maximize her success with intensive lifestyle modifications for her multiple health conditions.  Attestation Statements:   Reviewed by clinician on day of visit: allergies, medications, problem list, medical history, surgical history, family history, social history, and previous encounter notes.   Time spent on visit including pre-visit chart review and post-visit care and charting was 31 minutes.    Eissa Buchberger, PA-C

## 2023-06-09 ENCOUNTER — Ambulatory Visit (INDEPENDENT_AMBULATORY_CARE_PROVIDER_SITE_OTHER): Payer: Commercial Managed Care - PPO | Admitting: Family Medicine

## 2023-07-21 ENCOUNTER — Encounter (INDEPENDENT_AMBULATORY_CARE_PROVIDER_SITE_OTHER): Payer: Self-pay | Admitting: Family Medicine

## 2023-07-21 ENCOUNTER — Ambulatory Visit (INDEPENDENT_AMBULATORY_CARE_PROVIDER_SITE_OTHER): Admitting: Family Medicine

## 2023-07-21 DIAGNOSIS — R7303 Prediabetes: Secondary | ICD-10-CM | POA: Diagnosis not present

## 2023-07-21 DIAGNOSIS — Z6839 Body mass index (BMI) 39.0-39.9, adult: Secondary | ICD-10-CM

## 2023-07-21 DIAGNOSIS — E785 Hyperlipidemia, unspecified: Secondary | ICD-10-CM

## 2023-07-21 DIAGNOSIS — E7849 Other hyperlipidemia: Secondary | ICD-10-CM

## 2023-07-21 DIAGNOSIS — E559 Vitamin D deficiency, unspecified: Secondary | ICD-10-CM

## 2023-07-21 NOTE — Progress Notes (Signed)
 SUBJECTIVE:  Chief Complaint: Obesity  Interim History: Patient first appointment back with me since January.  She feels less motivated since the holidays.  She celebrated her birthday and made herself a really beautiful cake.  She fell into prior eating habits of staying up later and staying up later meant she was snacking more.  Feels summer is coming and that would be a good motivation for her to get more consistently back to her plan.   Cheyenne Keller is here to discuss her progress with her obesity treatment plan. She is on the Category 2 Plan and states she is following her eating plan approximately 50 % of the time. She states she is walking and using weights 30-45 minutes 2 times per week.   OBJECTIVE: Visit Diagnoses: Problem List Items Addressed This Visit       Other   Hyperlipidemia   Last lipid panel with elevated LDL.  Will repeat FLP today and discuss labs at next appointment.      Relevant Orders   Lipid Panel With LDL/HDL Ratio (Completed)   Vitamin D  deficiency   Previously low Vitamin D  level- will repeat level today to assess response to supplementation.      Relevant Orders   VITAMIN D  25 Hydroxy (Vit-D Deficiency, Fractures) (Completed)   Prediabetes   Elevated A1c previously.  Patient has struggled with indulgent food consumption since the holidays.  CMP, A1c and Insulin  ordered today.  Will discuss results at next appointment.      Relevant Orders   Comprehensive metabolic panel with GFR (Completed)   Hemoglobin A1c (Completed)   Insulin , random (Completed)   Morbid obesity (HCC) - Primary   Anthropometric Measurements Height: 5\' 2"  (1.575 m) Weight: 214 lb (97.1 kg) BMI (Calculated): 39.13 Weight at Last Visit: 205 lb Weight Lost Since Last Visit: 0 Weight Gained Since Last Visit: 9 Starting Weight: 218 lb Total Weight Loss (lbs): 9 lb (4.082 kg) Body Composition  Body Fat %: 45.1 % Fat Mass (lbs): 96.8 lbs Muscle Mass (lbs): 111.8 lbs Total  Body Water (lbs): 82.8 lbs Visceral Fat Rating : 12 Other Clinical Data Fasting: yes Labs: yes Today's Visit #: 11 Starting Date: 09/30/22 Comments: Cat 2       Other Visit Diagnoses       BMI 39.0-39.9,adult           No data recorded       07/21/2023    9:00 AM 04/30/2023    9:00 AM 04/02/2023   10:00 AM  Vitals with BMI  Height 5\' 2"  5\' 2"  5\' 2"   Weight 214 lbs 205 lbs 205 lbs  BMI 39.13 37.49 37.49  Systolic 110 114 960  Diastolic 61 71 65  Pulse 71 67 72      ASSESSMENT AND PLAN:  Diet: Cheyenne Keller is currently in the action stage of change. As such, her goal is to continue with weight loss efforts and has agreed to the Category 2 Plan and the Pescatarian Plan. Plan to get grocery shopping done today.   Exercise:  For substantial health benefits, adults should do at least 150 minutes (2 hours and 30 minutes) a week of moderate-intensity, or 75 minutes (1 hour and 15 minutes) a week of vigorous-intensity aerobic physical activity, or an equivalent combination of moderate- and vigorous-intensity aerobic activity. Aerobic activity should be performed in episodes of at least 10 minutes, and preferably, it should be spread throughout the week.  Behavior Modification:  We discussed the following  Behavioral Modification Strategies today: increasing lean protein intake, decreasing simple carbohydrates, increasing vegetables, meal planning and cooking strategies, keeping healthy foods in the home, and avoiding temptations.   Return in about 6 weeks (around 09/01/2023).   She was informed of the importance of frequent follow up visits to maximize her success with intensive lifestyle modifications for her multiple health conditions.  Attestation Statements:   Reviewed by clinician on day of visit: allergies, medications, problem list, medical history, surgical history, family history, social history, and previous encounter notes.     Donaciano Frizzle, MD

## 2023-07-22 ENCOUNTER — Encounter (INDEPENDENT_AMBULATORY_CARE_PROVIDER_SITE_OTHER): Payer: Self-pay | Admitting: Family Medicine

## 2023-07-22 LAB — LIPID PANEL WITH LDL/HDL RATIO
Cholesterol, Total: 162 mg/dL (ref 100–199)
HDL: 74 mg/dL (ref >39)
LDL Chol Calc (NIH): 77 mg/dL (ref 0–99)
LDL/HDL Ratio: 1 ratio (ref 0.0–3.2)
Triglycerides: 54 mg/dL (ref 0–149)
VLDL Cholesterol Cal: 11 mg/dL (ref 5–40)

## 2023-07-22 LAB — COMPREHENSIVE METABOLIC PANEL WITH GFR
ALT: 21 IU/L (ref 0–32)
AST: 19 IU/L (ref 0–40)
Albumin: 4.1 g/dL (ref 3.9–4.9)
Alkaline Phosphatase: 99 IU/L (ref 44–121)
BUN/Creatinine Ratio: 21 (ref 9–23)
BUN: 15 mg/dL (ref 6–24)
Bilirubin Total: 0.4 mg/dL (ref 0.0–1.2)
CO2: 24 mmol/L (ref 20–29)
Calcium: 9.1 mg/dL (ref 8.7–10.2)
Chloride: 99 mmol/L (ref 96–106)
Creatinine, Ser: 0.72 mg/dL (ref 0.57–1.00)
Globulin, Total: 3.2 g/dL (ref 1.5–4.5)
Glucose: 79 mg/dL (ref 70–99)
Potassium: 4.4 mmol/L (ref 3.5–5.2)
Sodium: 137 mmol/L (ref 134–144)
Total Protein: 7.3 g/dL (ref 6.0–8.5)
eGFR: 106 mL/min/{1.73_m2} (ref >59)

## 2023-07-22 LAB — HEMOGLOBIN A1C
Est. average glucose Bld gHb Est-mCnc: 126 mg/dL
Hgb A1c MFr Bld: 6 % — ABNORMAL HIGH (ref 4.8–5.6)

## 2023-07-22 LAB — VITAMIN D 25 HYDROXY (VIT D DEFICIENCY, FRACTURES): Vit D, 25-Hydroxy: 32.4 ng/mL (ref 30.0–100.0)

## 2023-07-22 LAB — INSULIN, RANDOM: INSULIN: 6.2 u[IU]/mL (ref 2.6–24.9)

## 2023-07-22 NOTE — Telephone Encounter (Signed)
 fyi

## 2023-07-28 ENCOUNTER — Telehealth: Payer: Self-pay | Admitting: *Deleted

## 2023-07-28 NOTE — Telephone Encounter (Signed)
 Copied from CRM [id number]. Topic: Clinical - Medication Question >> Jul 28, 2023 11:50 AM Adonis Hoot wrote: Reason for CRM: Patient called in stating that she was prescribed wegovy  and zepbound  last year and she never picked it up. She would like to know if she have those prescriptions represcribed so that she can see which one is cheaper and she would purchase  [id number] - [place], [place] - [address] AT [facility] [address] [facility] [address]  Phone: [phone] Fax: [phone]

## 2023-07-28 NOTE — Telephone Encounter (Signed)
 Patient is aware

## 2023-07-29 NOTE — Assessment & Plan Note (Signed)
 Previously low Vitamin D  level- will repeat level today to assess response to supplementation.

## 2023-07-29 NOTE — Assessment & Plan Note (Signed)
 Last lipid panel with elevated LDL.  Will repeat FLP today and discuss labs at next appointment.

## 2023-07-29 NOTE — Assessment & Plan Note (Signed)
 Elevated A1c previously.  Patient has struggled with indulgent food consumption since the holidays.  CMP, A1c and Insulin  ordered today.  Will discuss results at next appointment.

## 2023-07-29 NOTE — Assessment & Plan Note (Signed)
 Anthropometric Measurements Height: 5\' 2"  (1.575 m) Weight: 214 lb (97.1 kg) BMI (Calculated): 39.13 Weight at Last Visit: 205 lb Weight Lost Since Last Visit: 0 Weight Gained Since Last Visit: 9 Starting Weight: 218 lb Total Weight Loss (lbs): 9 lb (4.082 kg) Body Composition  Body Fat %: 45.1 % Fat Mass (lbs): 96.8 lbs Muscle Mass (lbs): 111.8 lbs Total Body Water (lbs): 82.8 lbs Visceral Fat Rating : 12 Other Clinical Data Fasting: yes Labs: yes Today's Visit #: 11 Starting Date: 09/30/22 Comments: Cat 2

## 2023-08-17 NOTE — Telephone Encounter (Signed)
 Please review

## 2023-09-01 ENCOUNTER — Ambulatory Visit (INDEPENDENT_AMBULATORY_CARE_PROVIDER_SITE_OTHER): Admitting: Family Medicine

## 2023-09-03 ENCOUNTER — Ambulatory Visit: Admitting: Family Medicine

## 2023-09-22 ENCOUNTER — Ambulatory Visit (INDEPENDENT_AMBULATORY_CARE_PROVIDER_SITE_OTHER): Admitting: Internal Medicine

## 2023-09-22 ENCOUNTER — Encounter: Payer: Self-pay | Admitting: Internal Medicine

## 2023-09-22 ENCOUNTER — Ambulatory Visit: Payer: Self-pay | Admitting: Internal Medicine

## 2023-09-22 VITALS — BP 108/68 | HR 94 | Temp 98.3°F | Wt 207.7 lb

## 2023-09-22 DIAGNOSIS — R200 Anesthesia of skin: Secondary | ICD-10-CM | POA: Diagnosis not present

## 2023-09-22 LAB — VITAMIN B12: Vitamin B-12: 478 pg/mL (ref 211–911)

## 2023-09-22 LAB — TSH: TSH: 1.53 u[IU]/mL (ref 0.35–5.50)

## 2023-09-22 NOTE — Progress Notes (Signed)
     Established Patient Office Visit     CC/Reason for Visit: Numbness of hands  HPI: Cheyenne Keller is a 43 y.o. female who is coming in today for the above mentioned reasons.  For the past few weeks has been experiencing burning and numbness of the hypothenar eminences of both hands.  She has recently started lifting weights.  No pain or weakness.  She also transiently had some numbness of her posterior thigh and foot but that resolved rather quickly.   Past Medical/Surgical History: Past Medical History:  Diagnosis Date   Anxiety    Arthritis    Asthma, exercise induced    Back pain    Fibroid    Gall stones    High cholesterol    Iron  deficiency anemia    Lactose intolerance    PCOS (polycystic ovarian syndrome)    Pelvic adhesions    SOB (shortness of breath)    Stomach ulcer    Vitamin D  deficiency     Past Surgical History:  Procedure Laterality Date   CESAREAN SECTION N/A 09/28/2018   Procedure: Primary CESAREAN SECTION;  Surgeon: Rutherford Gain, MD;  Location: [facility];  Service: Obstetrics;  Laterality: N/A;  EDD: 10/19/18   MYOMECTOMY     VAGINAL HYSTERECTOMY  2022    Social History:  reports that she has never smoked. She has never used smokeless tobacco. She reports that she does not currently use alcohol. She reports that she does not use drugs.  Allergies: No Active Allergies  Family History:  Family History  Problem Relation Age of Onset   Stroke Mother    Anxiety disorder Mother    Anxiety disorder Father    Heart disease Father    Kidney disease Father    Alcoholism Father    Colon cancer Neg Hx    Stomach cancer Neg Hx    Esophageal cancer Neg Hx      Current Outpatient Medications:    atorvastatin  (LIPITOR) 20 MG tablet, TAKE 1 TABLET(20 MG) BY MOUTH DAILY, Disp: 90 tablet, Rfl: 1   Magnesium  Glycinate 120 MG CAPS, Take 240 mg by mouth., Disp: , Rfl:    Multiple Vitamin (MULTIVITAMIN ADULT PO), Take by mouth., Disp: ,  Rfl:    tirzepatide  (MOUNJARO ) 7.5 MG/0.5ML Pen, Inject 7.5 mg into the skin once a week., Disp: , Rfl:   Review of Systems:  Negative unless indicated in HPI.   Physical Exam: Vitals:   09/22/23 1108  BP: 108/68  Pulse: 94  Temp: 98.3 F (36.8 C)  TempSrc: Oral  SpO2: 98%  Weight: 207 lb 11.2 oz (94.2 kg)    Body mass index is 37.99 kg/m.    Impression and Plan:  Numbness in both hands -     TSH; Future -     Vitamin B12; Future   - Etiology unclear, rule out hypothyroidism and B12 deficiency. - Possibly related to recent weight training, unlikely to be cervical in nature due to absence of neck pain or other symptoms. - Advised observation for now, if no improvement consider neurology referral.  Time spent:22 minutes reviewing chart, interviewing and examining patient and formulating plan of care.     Tully Theophilus Andrews, MD [facility]

## 2024-01-04 ENCOUNTER — Encounter (INDEPENDENT_AMBULATORY_CARE_PROVIDER_SITE_OTHER): Payer: Self-pay | Admitting: Family Medicine

## 2024-01-04 ENCOUNTER — Ambulatory Visit (INDEPENDENT_AMBULATORY_CARE_PROVIDER_SITE_OTHER): Admitting: Family Medicine

## 2024-01-04 VITALS — BP 112/66 | HR 88 | Temp 97.2°F | Ht 62.0 in | Wt 192.0 lb

## 2024-01-04 DIAGNOSIS — Z6835 Body mass index (BMI) 35.0-35.9, adult: Secondary | ICD-10-CM

## 2024-01-04 DIAGNOSIS — E669 Obesity, unspecified: Secondary | ICD-10-CM

## 2024-01-04 DIAGNOSIS — R7303 Prediabetes: Secondary | ICD-10-CM

## 2024-01-04 DIAGNOSIS — E7849 Other hyperlipidemia: Secondary | ICD-10-CM | POA: Diagnosis not present

## 2024-01-04 DIAGNOSIS — E559 Vitamin D deficiency, unspecified: Secondary | ICD-10-CM

## 2024-01-04 NOTE — Progress Notes (Signed)
 SUBJECTIVE:  Chief Complaint: Obesity  Interim History: patient's last appointment in May of this year.  She had an enjoyable summer doing some traveling.  Her daughter started kindergarten.  Foodwise she has been doing relatively well following her meal plan.  She has started compounded tirzepatide .  She is aiming to eat 100-120 grams of protein daily.  She is using the snack calories to get some indulgent food in.  She is getting around 1200 calories daily and is tracking with My Fitness Pal. She is planning to go trick or treating with her kids for Halloween.    Cheyenne Keller is here to discuss her progress with her obesity treatment plan. She is on the Category 2 Plan and states she is following her eating plan approximately 75-85 % of the time. She states she is exercising 60 minutes 4-7 times per week.   OBJECTIVE: Visit Diagnoses: Problem List Items Addressed This Visit       Other   Hyperlipidemia - Primary   Relevant Orders   Lipid Panel With LDL/HDL Ratio   Vitamin D  deficiency   Relevant Orders   VITAMIN D  25 Hydroxy (Vit-D Deficiency, Fractures)   Prediabetes   Relevant Orders   Comprehensive metabolic panel with GFR   Hemoglobin A1c   Morbid obesity (HCC)   Relevant Medications   tirzepatide  (ZEPBOUND ) 12.5 MG/0.5ML Pen   Other Visit Diagnoses       BMI 35.0-35.9,adult           Vitals Temp: (!) 97.2 F (36.2 C) BP: 112/66 Pulse Rate: 88 SpO2: 99 %   Anthropometric Measurements Height: 5' 2 (1.575 m) Weight: 192 lb (87.1 kg) BMI (Calculated): 35.11 Weight at Last Visit: 214 lb Weight Lost Since Last Visit: 22 lb Starting Weight: 218 lb Total Weight Loss (lbs): 26 lb (11.8 kg)   Body Composition  Body Fat %: 43 % Fat Mass (lbs): 82.6 lbs Muscle Mass (lbs): 104 lbs Total Body Water (lbs): 79 lbs Visceral Fat Rating : 10   Other Clinical Data Fasting: no Labs: yes Today's Visit #: 12 Starting Date: 09/30/22 Comments: Cat  2,     ASSESSMENT AND PLAN: Assessment & Plan Other hyperlipidemia Fasting for labs.  Will order fasting lipid panel and plan to discuss labs at next appointment as well as place patient in ASCVD risk calculator to discuss risk at that time. Prediabetes Hemoglobin A1c and insulin  level ordered today.  No change in current dietary and lifestyle modifications as outlined below. Vitamin D  deficiency Assessing response to vitamin D  supplementation by reordering vitamin D  level today.  No change in current treatment until vitamin D  level results and we have discussed those results at next appointment. BMI 35.0-35.9,adult  Obesity with starting BMI of 40.0    Diet: [name] is currently in the action stage of change. As such, her goal is to continue with weight loss efforts and has agreed to keeping a food journal and adhering to recommended goals of 1100-1200 calories and 85 or more grams of protein.   Exercise:  For substantial health benefits, adults should do at least 150 minutes (2 hours and 30 minutes) a week of moderate-intensity, or 75 minutes (1 hour and 15 minutes) a week of vigorous-intensity aerobic physical activity, or an equivalent combination of moderate- and vigorous-intensity aerobic activity. Aerobic activity should be performed in episodes of at least 10 minutes, and preferably, it should be spread throughout the week.  Behavior Modification:  We discussed the following Behavioral Modification  Strategies today: increasing lean protein intake, decreasing simple carbohydrates, meal planning and cooking strategies, planning for success, and keep a strict food journal. We discussed various medication options to help [name] with her weight loss efforts and we both agreed to continue tirzepatide  at current dose- she is currently on the 12.5mg  dose.  Return in about 12 weeks (around 03/28/2024).   She was informed of the importance of frequent follow up visits to maximize her  success with intensive lifestyle modifications for her multiple health conditions.  Attestation Statements:   Reviewed by clinician on day of visit: allergies, medications, problem list, medical history, surgical history, family history, social history, and previous encounter notes.    [name], MD

## 2024-01-05 LAB — COMPREHENSIVE METABOLIC PANEL WITH GFR
ALT: 12 IU/L (ref 0–32)
AST: 14 IU/L (ref 0–40)
Albumin: 4 g/dL (ref 3.9–4.9)
Alkaline Phosphatase: 87 IU/L (ref 41–116)
BUN/Creatinine Ratio: 20 (ref 9–23)
BUN: 19 mg/dL (ref 6–24)
Bilirubin Total: 0.3 mg/dL (ref 0.0–1.2)
CO2: 21 mmol/L (ref 20–29)
Calcium: 9.3 mg/dL (ref 8.7–10.2)
Chloride: 102 mmol/L (ref 96–106)
Creatinine, Ser: 0.96 mg/dL (ref 0.57–1.00)
Globulin, Total: 3.5 g/dL (ref 1.5–4.5)
Glucose: 81 mg/dL (ref 70–99)
Potassium: 4.3 mmol/L (ref 3.5–5.2)
Sodium: 136 mmol/L (ref 134–144)
Total Protein: 7.5 g/dL (ref 6.0–8.5)
eGFR: 75 mL/min/1.73 (ref >59)

## 2024-01-05 LAB — LIPID PANEL WITH LDL/HDL RATIO
Cholesterol, Total: 187 mg/dL (ref 100–199)
HDL: 66 mg/dL (ref >39)
LDL Chol Calc (NIH): 112 mg/dL — ABNORMAL HIGH (ref 0–99)
LDL/HDL Ratio: 1.7 ratio (ref 0.0–3.2)
Triglycerides: 46 mg/dL (ref 0–149)
VLDL Cholesterol Cal: 9 mg/dL (ref 5–40)

## 2024-01-05 LAB — HEMOGLOBIN A1C
Est. average glucose Bld gHb Est-mCnc: 117 mg/dL
Hgb A1c MFr Bld: 5.7 % — ABNORMAL HIGH (ref 4.8–5.6)

## 2024-01-05 LAB — VITAMIN D 25 HYDROXY (VIT D DEFICIENCY, FRACTURES): Vit D, 25-Hydroxy: 32.6 ng/mL (ref 30.0–100.0)

## 2024-01-10 NOTE — Assessment & Plan Note (Signed)
 Hemoglobin A1c and insulin  level ordered today.  No change in current dietary and lifestyle modifications as outlined below.

## 2024-01-10 NOTE — Assessment & Plan Note (Signed)
 Fasting for labs.  Will order fasting lipid panel and plan to discuss labs at next appointment as well as place patient in ASCVD risk calculator to discuss risk at that time.

## 2024-01-10 NOTE — Assessment & Plan Note (Signed)
 Assessing response to vitamin D  supplementation by reordering vitamin D  level today.  No change in current treatment until vitamin D  level results and we have discussed those results at next appointment.

## 2024-04-25 ENCOUNTER — Ambulatory Visit (INDEPENDENT_AMBULATORY_CARE_PROVIDER_SITE_OTHER): Payer: Self-pay | Admitting: Family Medicine

## 2024-06-13 ENCOUNTER — Ambulatory Visit (INDEPENDENT_AMBULATORY_CARE_PROVIDER_SITE_OTHER): Admitting: Family Medicine
